# Patient Record
Sex: Male | Born: 1970 | Race: White | Hispanic: No | Marital: Married | Smoking: Never smoker
Health system: Southern US, Community
[De-identification: ages and names within clinical notes are randomized; demographics above are authoritative.]

## PROBLEM LIST (undated history)

## (undated) DIAGNOSIS — C3490 Malignant neoplasm of unspecified part of unspecified bronchus or lung: Secondary | ICD-10-CM

## (undated) DIAGNOSIS — R05 Cough: Secondary | ICD-10-CM

## (undated) DIAGNOSIS — Z9889 Other specified postprocedural states: Secondary | ICD-10-CM

## (undated) DIAGNOSIS — Z8739 Personal history of other diseases of the musculoskeletal system and connective tissue: Secondary | ICD-10-CM

## (undated) DIAGNOSIS — J45909 Unspecified asthma, uncomplicated: Secondary | ICD-10-CM

## (undated) DIAGNOSIS — M1990 Unspecified osteoarthritis, unspecified site: Secondary | ICD-10-CM

## (undated) DIAGNOSIS — K219 Gastro-esophageal reflux disease without esophagitis: Secondary | ICD-10-CM

## (undated) DIAGNOSIS — F32A Depression, unspecified: Secondary | ICD-10-CM

## (undated) DIAGNOSIS — F419 Anxiety disorder, unspecified: Secondary | ICD-10-CM

## (undated) DIAGNOSIS — F329 Major depressive disorder, single episode, unspecified: Secondary | ICD-10-CM

## (undated) DIAGNOSIS — E119 Type 2 diabetes mellitus without complications: Secondary | ICD-10-CM

## (undated) DIAGNOSIS — Z8709 Personal history of other diseases of the respiratory system: Secondary | ICD-10-CM

## (undated) DIAGNOSIS — R112 Nausea with vomiting, unspecified: Secondary | ICD-10-CM

## (undated) DIAGNOSIS — J189 Pneumonia, unspecified organism: Secondary | ICD-10-CM

## (undated) DIAGNOSIS — M2550 Pain in unspecified joint: Secondary | ICD-10-CM

## (undated) DIAGNOSIS — IMO0001 Reserved for inherently not codable concepts without codable children: Secondary | ICD-10-CM

## (undated) DIAGNOSIS — R059 Cough, unspecified: Secondary | ICD-10-CM

## (undated) HISTORY — PX: BACK SURGERY: SHX140

## (undated) HISTORY — PX: HERNIA REPAIR: SHX51

## (undated) HISTORY — DX: Unspecified asthma, uncomplicated: J45.909

## (undated) HISTORY — PX: OTHER SURGICAL HISTORY: SHX169

## (undated) HISTORY — DX: Malignant neoplasm of unspecified part of unspecified bronchus or lung: C34.90

## (undated) HISTORY — DX: Unspecified osteoarthritis, unspecified site: M19.90

## (undated) HISTORY — PX: KNEE ARTHROSCOPY: SUR90

## (undated) HISTORY — PX: JOINT REPLACEMENT: SHX530

## (undated) HISTORY — PX: APPENDECTOMY: SHX54

---

## 1998-07-18 ENCOUNTER — Inpatient Hospital Stay (HOSPITAL_COMMUNITY): Admission: RE | Admit: 1998-07-18 | Discharge: 1998-07-19 | Payer: Self-pay | Admitting: Neurosurgery

## 1998-08-08 ENCOUNTER — Inpatient Hospital Stay (HOSPITAL_COMMUNITY): Admission: RE | Admit: 1998-08-08 | Discharge: 1998-08-10 | Payer: Self-pay | Admitting: Neurosurgery

## 1998-12-31 ENCOUNTER — Ambulatory Visit (HOSPITAL_COMMUNITY): Admission: RE | Admit: 1998-12-31 | Discharge: 1998-12-31 | Payer: Self-pay | Admitting: Neurosurgery

## 1999-05-29 HISTORY — PX: BACK SURGERY: SHX140

## 2001-04-02 ENCOUNTER — Emergency Department (HOSPITAL_COMMUNITY): Admission: EM | Admit: 2001-04-02 | Discharge: 2001-04-02 | Payer: Self-pay | Admitting: Emergency Medicine

## 2011-04-29 ENCOUNTER — Emergency Department (HOSPITAL_COMMUNITY): Payer: BC Managed Care – PPO

## 2011-04-29 ENCOUNTER — Encounter: Payer: Self-pay | Admitting: Emergency Medicine

## 2011-04-29 ENCOUNTER — Emergency Department (HOSPITAL_COMMUNITY)
Admission: EM | Admit: 2011-04-29 | Discharge: 2011-04-29 | Disposition: A | Discharge status: Home / Self Care | Payer: BC Managed Care – PPO | Attending: Emergency Medicine | Admitting: Emergency Medicine

## 2011-04-29 DIAGNOSIS — S060X9A Concussion with loss of consciousness of unspecified duration, initial encounter: Secondary | ICD-10-CM

## 2011-04-29 DIAGNOSIS — S060X1A Concussion with loss of consciousness of 30 minutes or less, initial encounter: Secondary | ICD-10-CM | POA: Insufficient documentation

## 2011-04-29 DIAGNOSIS — R110 Nausea: Secondary | ICD-10-CM | POA: Insufficient documentation

## 2011-04-29 DIAGNOSIS — F3289 Other specified depressive episodes: Secondary | ICD-10-CM | POA: Insufficient documentation

## 2011-04-29 DIAGNOSIS — Z79899 Other long term (current) drug therapy: Secondary | ICD-10-CM | POA: Insufficient documentation

## 2011-04-29 DIAGNOSIS — F329 Major depressive disorder, single episode, unspecified: Secondary | ICD-10-CM | POA: Insufficient documentation

## 2011-04-29 DIAGNOSIS — W1809XA Striking against other object with subsequent fall, initial encounter: Secondary | ICD-10-CM | POA: Insufficient documentation

## 2011-04-29 DIAGNOSIS — R413 Other amnesia: Secondary | ICD-10-CM | POA: Insufficient documentation

## 2011-04-29 DIAGNOSIS — R51 Headache: Secondary | ICD-10-CM | POA: Insufficient documentation

## 2011-04-29 DIAGNOSIS — F29 Unspecified psychosis not due to a substance or known physiological condition: Secondary | ICD-10-CM | POA: Insufficient documentation

## 2011-04-29 DIAGNOSIS — M542 Cervicalgia: Secondary | ICD-10-CM | POA: Insufficient documentation

## 2011-04-29 HISTORY — DX: Major depressive disorder, single episode, unspecified: F32.9

## 2011-04-29 HISTORY — DX: Depression, unspecified: F32.A

## 2011-04-29 LAB — COMPREHENSIVE METABOLIC PANEL
Albumin: 3.8 g/dL (ref 3.5–5.2)
Alkaline Phosphatase: 65 U/L (ref 39–117)
BUN: 19 mg/dL (ref 6–23)
Potassium: 4 mEq/L (ref 3.5–5.1)
Total Protein: 7 g/dL (ref 6.0–8.3)

## 2011-04-29 LAB — URINALYSIS, MICROSCOPIC ONLY
Bilirubin Urine: NEGATIVE
Hgb urine dipstick: NEGATIVE
Specific Gravity, Urine: 1.022 (ref 1.005–1.030)
pH: 7 (ref 5.0–8.0)

## 2011-04-29 LAB — CBC
HCT: 40.9 % (ref 39.0–52.0)
MCV: 87.8 fL (ref 78.0–100.0)
RBC: 4.66 MIL/uL (ref 4.22–5.81)
WBC: 6.5 10*3/uL (ref 4.0–10.5)

## 2011-04-29 LAB — LACTIC ACID, PLASMA: Lactic Acid, Venous: 1.6 mmol/L (ref 0.5–2.2)

## 2011-04-29 LAB — PROTIME-INR
INR: 1.01 (ref 0.00–1.49)
Prothrombin Time: 13.5 seconds (ref 11.6–15.2)

## 2011-04-29 LAB — POCT I-STAT, CHEM 8
Calcium, Ion: 1.12 mmol/L (ref 1.12–1.32)
Glucose, Bld: 85 mg/dL (ref 70–99)
HCT: 41 % (ref 39.0–52.0)
Hemoglobin: 13.9 g/dL (ref 13.0–17.0)

## 2011-04-29 LAB — SAMPLE TO BLOOD BANK

## 2011-04-29 MED ORDER — ONDANSETRON 8 MG PO TBDP: 8.0000 mg | ORAL_TABLET | Freq: Three times a day (TID) | ORAL | Status: AC | PRN

## 2011-04-29 MED ORDER — HYDROCODONE-ACETAMINOPHEN 5-325 MG PO TABS: 1.0000 | ORAL_TABLET | Freq: Four times a day (QID) | ORAL | Status: AC | PRN

## 2011-04-29 MED ORDER — ONDANSETRON HCL 4 MG/2ML IJ SOLN
INTRAMUSCULAR | Status: AC
  Administered 2011-04-29: 17:00:00
  Filled 2011-04-29: qty 4

## 2011-04-29 NOTE — ED Notes (Signed)
Paged Trauma to [phone] Dr Deretha Emory

## 2011-04-29 NOTE — ED Notes (Signed)
Pt reported to be swinging from vine when he fell and hit head on log.  Positive loss of consciousness.

## 2011-04-29 NOTE — ED Notes (Signed)
Pt returned from CT at this time.  

## 2011-04-29 NOTE — ED Provider Notes (Signed)
History     CSN: [id number] Arrival date & time: 04/29/2011  4:52 PM   First MD Initiated Contact with Patient 04/29/11 1705      Chief Complaint  Patient presents with  . Fall  . Head Injury  . Trauma    (Consider location/radiation/quality/duration/timing/severity/associated sxs/prior treatment) Patient is a 40 y.o. male presenting with head injury. The history is provided by the patient and the EMS personnel.  Head Injury  The incident occurred 1 to 2 hours ago. He came to the ER via EMS. The injury mechanism was a direct blow and a fall. He lost consciousness for a period of 1 to 5 minutes. There was no blood loss. The quality of the pain is described as dull. The pain is at a severity of 6/10. The pain is moderate. The pain has been constant since the injury. Associated symptoms include disorientation and memory loss. Pertinent negatives include no numbness, no blurred vision, no vomiting, no tinnitus and no weakness. Associated symptoms comments: Nausea. He was found conscious, confused and responsive to voice by EMS personnel. Treatment on the scene included a backboard, a c-collar and IV fluid. Treatments tried: Patient given Zofran and route by EMS.    Patient was swinging from a fine with his children when he fell off a line and struck the back of his head on a fallen tree. Patient had definite loss of consciousness most likely less than 5 minutes. Awoke confused could not remember events [place] recognizing family. EMS when they got to the scene the patient was up and ambulating but had memory loss, was able to follow commands  Past Medical History  Diagnosis Date  . Depression     Past Surgical History  Procedure Date  . Back surgery     History reviewed. No pertinent family history.  History  Substance Use Topics  . Smoking status: Not on file  . Smokeless tobacco: Not on file  . Alcohol Use: No      Review of Systems  Constitutional: Negative for fever and  chills.  HENT: Positive for neck pain and neck stiffness. Negative for nosebleeds, congestion, drooling, trouble swallowing, voice change and tinnitus.   Eyes: Negative for blurred vision, photophobia and visual disturbance.  Respiratory: Negative for cough, chest tightness and shortness of breath.   Cardiovascular: Negative for chest pain, palpitations and leg swelling.  Gastrointestinal: Positive for nausea. Negative for vomiting, abdominal pain and diarrhea.  Genitourinary: Negative for dysuria and hematuria.  Musculoskeletal: Negative for back pain and joint swelling.  Skin: Negative for rash and wound.  Neurological: Positive for headaches. Negative for facial asymmetry, speech difficulty, weakness and numbness.  Psychiatric/Behavioral: Positive for memory loss and confusion.    Allergies  Ciprofloxacin and Naproxen  Home Medications   Current Outpatient Rx  Name Route Sig Dispense Refill  . DULOXETINE HCL 60 MG PO CPEP Oral Take 60 mg by mouth daily.      . OMEGA-3 FATTY ACIDS 1000 MG PO CAPS Oral Take 2 g by mouth daily.      Marland Kitchen HYDROCODONE-ACETAMINOPHEN 5-325 MG PO TABS Oral Take 1-2 tablets by mouth every 6 (six) hours as needed for pain. 20 tablet 0  . ONDANSETRON 8 MG PO TBDP Oral Take 1 tablet (8 mg total) by mouth every 8 (eight) hours as needed for nausea. 12 tablet 0    BP 136/77  Pulse 94  Temp(Src) 98.1 F (36.7 C) (Oral)  Resp 26  Ht 5\' 10"  (1.778 m)  Wt 208 lb (94.348 kg)  BMI 29.84 kg/m2  SpO2 98%  Physical Exam  Nursing note and vitals reviewed. Constitutional: He appears well-developed and well-nourished.  HENT:  Right Ear: External ear normal.  Left Ear: External ear normal.  Mouth/Throat: Oropharynx is clear and moist.       Tender posterior scalp no sniffing and hematoma abrasion or step off.  Eyes: Conjunctivae and EOM are normal. Pupils are equal, round, and reactive to light.  Neck: No JVD present. No tracheal deviation present.       No  midline tenderness but mild paraspinous tenderness.  Cardiovascular: Normal rate, regular rhythm, normal heart sounds and intact distal pulses.   No murmur heard. Pulmonary/Chest: Effort normal and breath sounds normal. No respiratory distress. He exhibits no tenderness.  Abdominal: Soft. Bowel sounds are normal. There is no tenderness.  Genitourinary:       No pelvic instability or tenderness  Musculoskeletal: Normal range of motion. He exhibits no edema and no tenderness.  Lymphadenopathy:    He has no cervical adenopathy.  Neurological: He is alert. No cranial nerve deficit. He exhibits normal muscle tone. Coordination normal.       Positive memory deficit for immediate events prior to the injury during the injury and shortly after the injury. Back nontender no neurological deficits other than the memory deficit.  Skin: Skin is warm and dry. No rash noted. He is not diaphoretic. No erythema.    ED Course  Procedures (including critical care time)  Labs Reviewed  COMPREHENSIVE METABOLIC PANEL - Abnormal; Notable for the following:    GFR calc non Af Amer 79 (*)    All other components within normal limits  CBC  URINALYSIS, MICROSCOPIC ONLY  LACTIC ACID, PLASMA  PROTIME-INR  SAMPLE TO BLOOD BANK  POCT I-STAT, CHEM 8  I-STAT, CHEM 8    Date: 04/29/2011  Rate: 101  Rhythm: sinus tachycardia  QRS Axis: normal  Intervals: normal  ST/T Wave abnormalities: normal  Conduction Disutrbances:none  Narrative Interpretation:   Old EKG Reviewed: none available   Results for orders placed during the hospital encounter of 04/29/11  COMPREHENSIVE METABOLIC PANEL      Component Value Range   Sodium 142  135 - 145 (mEq/L)   Potassium 4.0  3.5 - 5.1 (mEq/L)   Chloride 105  96 - 112 (mEq/L)   CO2 27  19 - 32 (mEq/L)   Glucose, Bld 83  70 - 99 (mg/dL)   BUN 19  6 - 23 (mg/dL)   Creatinine, Ser 1.61  0.50 - 1.35 (mg/dL)   Calcium 8.8  8.4 - 09.6 (mg/dL)   Total Protein 7.0  6.0 - 8.3  (g/dL)   Albumin 3.8  3.5 - 5.2 (g/dL)   AST 26  0 - 37 (U/L)   ALT 48  0 - 53 (U/L)   Alkaline Phosphatase 65  39 - 117 (U/L)   Total Bilirubin 0.4  0.3 - 1.2 (mg/dL)   GFR calc non Af Amer 79 (*) >90 (mL/min)   GFR calc Af Amer >90  >90 (mL/min)  CBC      Component Value Range   WBC 6.5  4.0 - 10.5 (K/uL)   RBC 4.66  4.22 - 5.81 (MIL/uL)   Hemoglobin 14.4  13.0 - 17.0 (g/dL)   HCT 04.5  40.9 - 81.1 (%)   MCV 87.8  78.0 - 100.0 (fL)   MCH 30.9  26.0 - 34.0 (pg)   MCHC 35.2  30.0 - 36.0 (g/dL)   RDW 16.1  09.6 - 04.5 (%)   Platelets 223  150 - 400 (K/uL)  URINALYSIS, MICROSCOPIC ONLY      Component Value Range   Color, Urine YELLOW  YELLOW    APPearance CLEAR  CLEAR    Specific Gravity, Urine 1.022  1.005 - 1.030    pH 7.0  5.0 - 8.0    Glucose, UA NEGATIVE  NEGATIVE (mg/dL)   Hgb urine dipstick NEGATIVE  NEGATIVE    Bilirubin Urine NEGATIVE  NEGATIVE    Ketones, ur NEGATIVE  NEGATIVE (mg/dL)   Protein, ur NEGATIVE  NEGATIVE (mg/dL)   Urobilinogen, UA 1.0  0.0 - 1.0 (mg/dL)   Nitrite NEGATIVE  NEGATIVE    Leukocytes, UA NEGATIVE  NEGATIVE    WBC, UA 0-2  <3 (WBC/hpf)   RBC / HPF 0-2  <3 (RBC/hpf)  LACTIC ACID, PLASMA      Component Value Range   Lactic Acid, Venous 1.6  0.5 - 2.2 (mmol/L)  PROTIME-INR      Component Value Range   Prothrombin Time 13.5  11.6 - 15.2 (seconds)   INR 1.01  0.00 - 1.49   SAMPLE TO BLOOD BANK      Component Value Range   Blood Bank Specimen SAMPLE AVAILABLE FOR TESTING     Sample Expiration 04/30/2011    POCT I-STAT, CHEM 8      Component Value Range   Sodium 144  135 - 145 (mEq/L)   Potassium 4.1  3.5 - 5.1 (mEq/L)   Chloride 106  96 - 112 (mEq/L)   BUN 21  6 - 23 (mg/dL)   Creatinine, Ser 4.09  0.50 - 1.35 (mg/dL)   Glucose, Bld 85  70 - 99 (mg/dL)   Calcium, Ion 8.11  9.14 - 1.32 (mmol/L)   TCO2 27  0 - 100 (mmol/L)   Hemoglobin 13.9  13.0 - 17.0 (g/dL)   HCT 78.2  95.6 - 21.3 (%)   Results for orders placed during the hospital  encounter of 04/29/11  COMPREHENSIVE METABOLIC PANEL      Component Value Range   Sodium 142  135 - 145 (mEq/L)   Potassium 4.0  3.5 - 5.1 (mEq/L)   Chloride 105  96 - 112 (mEq/L)   CO2 27  19 - 32 (mEq/L)   Glucose, Bld 83  70 - 99 (mg/dL)   BUN 19  6 - 23 (mg/dL)   Creatinine, Ser 0.86  0.50 - 1.35 (mg/dL)   Calcium 8.8  8.4 - 57.8 (mg/dL)   Total Protein 7.0  6.0 - 8.3 (g/dL)   Albumin 3.8  3.5 - 5.2 (g/dL)   AST 26  0 - 37 (U/L)   ALT 48  0 - 53 (U/L)   Alkaline Phosphatase 65  39 - 117 (U/L)   Total Bilirubin 0.4  0.3 - 1.2 (mg/dL)   GFR calc non Af Amer 79 (*) >90 (mL/min)   GFR calc Af Amer >90  >90 (mL/min)  CBC      Component Value Range   WBC 6.5  4.0 - 10.5 (K/uL)   RBC 4.66  4.22 - 5.81 (MIL/uL)   Hemoglobin 14.4  13.0 - 17.0 (g/dL)   HCT 46.9  62.9 - 52.8 (%)   MCV 87.8  78.0 - 100.0 (fL)   MCH 30.9  26.0 - 34.0 (pg)   MCHC 35.2  30.0 - 36.0 (g/dL)   RDW 41.3  24.4 - 01.0 (%)  Platelets 223  150 - 400 (K/uL)  URINALYSIS, MICROSCOPIC ONLY      Component Value Range   Color, Urine YELLOW  YELLOW    APPearance CLEAR  CLEAR    Specific Gravity, Urine 1.022  1.005 - 1.030    pH 7.0  5.0 - 8.0    Glucose, UA NEGATIVE  NEGATIVE (mg/dL)   Hgb urine dipstick NEGATIVE  NEGATIVE    Bilirubin Urine NEGATIVE  NEGATIVE    Ketones, ur NEGATIVE  NEGATIVE (mg/dL)   Protein, ur NEGATIVE  NEGATIVE (mg/dL)   Urobilinogen, UA 1.0  0.0 - 1.0 (mg/dL)   Nitrite NEGATIVE  NEGATIVE    Leukocytes, UA NEGATIVE  NEGATIVE    WBC, UA 0-2  <3 (WBC/hpf)   RBC / HPF 0-2  <3 (RBC/hpf)  LACTIC ACID, PLASMA      Component Value Range   Lactic Acid, Venous 1.6  0.5 - 2.2 (mmol/L)  PROTIME-INR      Component Value Range   Prothrombin Time 13.5  11.6 - 15.2 (seconds)   INR 1.01  0.00 - 1.49   SAMPLE TO BLOOD BANK      Component Value Range   Blood Bank Specimen SAMPLE AVAILABLE FOR TESTING     Sample Expiration 04/30/2011    POCT I-STAT, CHEM 8      Component Value Range   Sodium 144   135 - 145 (mEq/L)   Potassium 4.1  3.5 - 5.1 (mEq/L)   Chloride 106  96 - 112 (mEq/L)   BUN 21  6 - 23 (mg/dL)   Creatinine, Ser 2.95  0.50 - 1.35 (mg/dL)   Glucose, Bld 85  70 - 99 (mg/dL)   Calcium, Ion 6.21  3.08 - 1.32 (mmol/L)   TCO2 27  0 - 100 (mmol/L)   Hemoglobin 13.9  13.0 - 17.0 (g/dL)   HCT 65.7  84.6 - 96.2 (%)   Ct Head Wo Contrast  04/29/2011  *RADIOLOGY REPORT*  Clinical Data:  Status post fall.  Headache and neck pain.  CT HEAD WITHOUT CONTRAST CT CERVICAL SPINE WITHOUT CONTRAST  Technique:  Multidetector CT imaging of the head and cervical spine was performed following the standard protocol without intravenous contrast.  Multiplanar CT image reconstructions of the cervical spine were also generated.  Comparison:  None.  CT HEAD  Findings: There is no evidence of acute intracranial hemorrhage, mass lesion, brain edema or extra-axial fluid collection.  The ventricles and subarachnoid spaces are appropriately sized for age. There is no CT evidence of acute cortical infarction.  There is maxillary sinus mucosal thickening with possible air fluid levels bilaterally.  The additional paranasal sinuses appear clear. Mastoids and middle ears are clear. The calvarium is intact.  IMPRESSION:  1.  No acute intracranial or calvarial findings. 2.  Bilateral maxillary sinus disease with possible air fluid levels.  CT CERVICAL SPINE  Findings: The cervical alignment is normal.  There is no evidence of acute fracture or traumatic subluxation.  No acute soft tissue findings are identified.  There is minimal spondylosis and no osseous neural foraminal stenosis.  IMPRESSION: Negative for acute cervical spine fracture, subluxation or static signs of instability.  Original Report Authenticated By: Gerrianne Scale, M.D.   Ct Cervical Spine Wo Contrast  04/29/2011  *RADIOLOGY REPORT*  Clinical Data:  Status post fall.  Headache and neck pain.  CT HEAD WITHOUT CONTRAST CT CERVICAL SPINE WITHOUT CONTRAST   Technique:  Multidetector CT imaging of the head and cervical  spine was performed following the standard protocol without intravenous contrast.  Multiplanar CT image reconstructions of the cervical spine were also generated.  Comparison:  None.  CT HEAD  Findings: There is no evidence of acute intracranial hemorrhage, mass lesion, brain edema or extra-axial fluid collection.  The ventricles and subarachnoid spaces are appropriately sized for age. There is no CT evidence of acute cortical infarction.  There is maxillary sinus mucosal thickening with possible air fluid levels bilaterally.  The additional paranasal sinuses appear clear. Mastoids and middle ears are clear. The calvarium is intact.  IMPRESSION:  1.  No acute intracranial or calvarial findings. 2.  Bilateral maxillary sinus disease with possible air fluid levels.  CT CERVICAL SPINE  Findings: The cervical alignment is normal.  There is no evidence of acute fracture or traumatic subluxation.  No acute soft tissue findings are identified.  There is minimal spondylosis and no osseous neural foraminal stenosis.  IMPRESSION: Negative for acute cervical spine fracture, subluxation or static signs of instability.  Original Report Authenticated By: Gerrianne Scale, M.D.   Dg Chest Portable 1 View  04/29/2011  *RADIOLOGY REPORT*  Clinical Data: Head trauma.  No chest complaints.  PORTABLE CHEST - 1 VIEW  Comparison: None.  Findings: There are low lung volumes with mild resulting perihilar atelectasis bilaterally.  No airspace opacity, edema or pleural effusion is present.  There is no pneumothorax.  The heart size and mediastinal contours are normal.  Telemetry leads overlie the chest.  IMPRESSION: Low lung volumes with resulting perihilar atelectasis.  Original Report Authenticated By: Gerrianne Scale, M.D.       1. Closed head injury with concussion       MDM  The patient arrived to the emergency department from [facility] as a level  II trauma. TRAUMA ALERT was made. He should upon arrival to the emergency department was awake but was amnestic to the events but was starting to recall events prior to the injury and some events after the injury. No evidence of any other injury other than a posterior head injury. CT scan of the head and neck were negative. Patient's presentation and history consistent with closed head injury with loss of consciousness and concussion. There was no evidence of any neuro deficits other than the memory loss.   During his stay in the emergency department he started to recall his family members started to recall more events of the past still has no definite recollection of the injury. Discussed with on call trauma surgeon since it was a level II trauma patient does have a primary care provider in the [place] area, okay to be discharged home and followup with his primary care provider in the next few days to determine when would be safe to return to work.   At time of discharge patient only had complained of him mild posterior headache, slight paraspinous neck stiffness but had good range of motion no spinal tenderness, denied chest or abdominal pain neuro exam showed no focal deficits.  CRITICAL CARE Performed by: Shelda Jakes.   Total critical care time: 30  Critical care time was exclusive of separately billable procedures and treating other patients.  Critical care was necessary to treat or prevent imminent or life-threatening deterioration.  Critical care was time spent personally by me on the following activities: development of treatment plan with patient and/or surrogate as well as nursing, discussions with consultants, evaluation of patient's response to treatment, examination of patient, obtaining history from patient  or surrogate, ordering and performing treatments and interventions, ordering and review of laboratory studies, ordering and review of radiographic studies, pulse oximetry and  re-evaluation of patient's condition.        Shelda Jakes, MD 04/29/11 2111

## 2014-06-23 ENCOUNTER — Ambulatory Visit (INDEPENDENT_AMBULATORY_CARE_PROVIDER_SITE_OTHER): Payer: Commercial Managed Care - PPO | Admitting: Internal Medicine

## 2014-06-23 ENCOUNTER — Encounter (INDEPENDENT_AMBULATORY_CARE_PROVIDER_SITE_OTHER): Payer: Self-pay

## 2014-06-23 ENCOUNTER — Encounter: Payer: Self-pay | Admitting: Internal Medicine

## 2014-06-23 ENCOUNTER — Ambulatory Visit (INDEPENDENT_AMBULATORY_CARE_PROVIDER_SITE_OTHER)
Admission: RE | Admit: 2014-06-23 | Discharge: 2014-06-23 | Disposition: A | Discharge status: Home / Self Care | Payer: Commercial Managed Care - PPO | Source: Ambulatory Visit | Attending: Internal Medicine | Admitting: Internal Medicine

## 2014-06-23 VITALS — BP 128/80 | HR 107 | Temp 98.1°F | Ht 70.0 in | Wt 212.0 lb

## 2014-06-23 DIAGNOSIS — R05 Cough: Secondary | ICD-10-CM | POA: Insufficient documentation

## 2014-06-23 DIAGNOSIS — R059 Cough, unspecified: Secondary | ICD-10-CM

## 2014-06-23 DIAGNOSIS — J852 Abscess of lung without pneumonia: Secondary | ICD-10-CM

## 2014-06-23 DIAGNOSIS — J4530 Mild persistent asthma, uncomplicated: Secondary | ICD-10-CM

## 2014-06-23 MED ORDER — BUDESONIDE-FORMOTEROL FUMARATE 80-4.5 MCG/ACT IN AERO: INHALATION_SPRAY | RESPIRATORY_TRACT | Status: DC

## 2014-06-23 NOTE — Progress Notes (Signed)
Quick Note:  LMTCB ______ 

## 2014-06-23 NOTE — Patient Instructions (Addendum)
pepcid 20 mg and chlortrimeton 4 mg one at bedtime  GERD (REFLUX)  is an extremely common cause of respiratory symptoms just like yours , many times with no obvious heartburn at all.    It can be treated with medication, but also with lifestyle changes including avoidance of late meals, excessive alcohol, smoking cessation, and avoid fatty foods, chocolate, peppermint, colas, red wine, and acidic juices such as orange juice.  NO MINT OR MENTHOL PRODUCTS SO NO COUGH DROPS  USE SUGARLESS CANDY INSTEAD (Jolley ranchers or Stover's or Life Savers) or even ice chips will also do - the key is to swallow to prevent all throat clearing. NO OIL BASED VITAMINS>> so stop fish oil - use powdered substitutes where available  Cough > mucinex dm 1200 mg every 12 hours  Please see patient coordinator before you leave today  to schedule sinus CT   Please remember to go to the  x-ray department downstairs for your tests - we will call you with the results when they are available.  Please schedule a follow up office visit in 2 weeks, sooner if needed

## 2014-06-23 NOTE — Progress Notes (Signed)
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 Advice Only    Referred by Dr. Owens Shark lung abscess:  pt has cough with clear to foamy sl. green at times. Pt has wheezing and sob also chest tightenss/congestion.  baseline no inhalers but sues  symbicort and both neb and alb hfa daily but then 3 weeks prior to OV   acutely ill and using symbicort ? Strength 2 every 12 h and needing rescue less and neb once  A day   Acutely ill fever, chills, lower back pain L, increased need for inhalers preceded x 2-3 weeks by sinus infection a week off omnicef before fever developed, rx levaquin and 12 days of zpak and pred rex. Now feels  75% better with cough / congestion as main symptoms. Denies excess etoh  No obvious day to day or daytime variabilty or assoc   cp or hb symptoms. No unusual exp hx or h/o childhood pna/ asthma or knowledge of premature birth.  Sleeping ok without nocturnal  or early am exacerbation  of respiratory  c/o's or need for noct saba. Also denies any obvious fluctuation of symptoms with weather or environmental changes or other aggravating or alleviating factors except as outlined above   Current Medications, Allergies, Complete Past Medical History, Past Surgical History, Family History, and Social History were reviewed in [employer] record.  ROS   The following are not active complaints unless bolded sore throat, dysphagia, dental problems, itching, sneezing,  nasal congestion or excess/ purulent secretions, ear ache,   fever, chills, sweats, unintended wt loss, pleuritic or exertional cp, hemoptysis,  orthopnea pnd or leg swelling, presyncope, palpitations, heartburn, abdominal pain, anorexia, nausea, vomiting, diarrhea  or change in bowel or urinary habits, change in stools or urine, dysuria,hematuria,  rash, arthralgias, visual complaints, headache, numbness weakness or ataxia or problems with walking or coordination,  change in mood/affect or memory.         Review of Systems     Objective:   Physical Exam     Wt Readings from Last 3 Encounters:  06/23/14 212 lb (96.163 kg)  04/29/11 208 lb (94.348 kg)    Vital signs reviewed   HEENT: nl dentition, turbinates, and orophanx. Nl external ear canals without cough reflex   NECK :  without JVD/Nodes/TM/ nl carotid upstrokes bilaterally   LUNGS: no acc muscle use, clear to A and P bilaterally without cough on insp or exp maneuvers   CV:  RRR  no s3 or murmur or increase in P2, no edema   ABD:  soft and nontender with nl excursion in the supine position. No bruits or organomegaly, bowel sounds nl  MS:  warm without deformities, calf tenderness, cyanosis or clubbing  SKIN: warm and dry without lesions  NEURO:  alert, approp, no deficits     CXR PA and Lateral:   06/23/2014 :     I personally reviewed images and agree with radiology impression as follows:    Unchanged consolidation and cavitation in the superior segment left lower lobe. This could be infectious, inflammatory or neoplastic. If the patient clinically has pneumonia, continued follow-up radiography is recommended to confirm complete clearance.  Assessment:

## 2014-06-27 ENCOUNTER — Encounter: Payer: Self-pay | Admitting: Internal Medicine

## 2014-06-27 DIAGNOSIS — J45909 Unspecified asthma, uncomplicated: Secondary | ICD-10-CM | POA: Insufficient documentation

## 2014-06-27 MED ORDER — AMOXICILLIN-POT CLAVULANATE 875-125 MG PO TABS: 1.0000 | ORAL_TABLET | Freq: Two times a day (BID) | ORAL | Status: DC

## 2014-06-27 NOTE — Assessment & Plan Note (Signed)
DDX of  difficult airways management all start with A and  include Adherence, Ace Inhibitors, Acid Reflux, Active Sinus Disease, Alpha 1 Antitripsin deficiency, Anxiety masquerading as Airways dz,  ABPA,  allergy(esp in young), Aspiration (esp in elderly), Adverse effects of DPI,  Active smokers, plus two Bs  = Bronchiectasis and Beta blocker use..and one C= CHF  Adherence is always the initial "prime suspect" and is a multilayered concern that requires a "trust but verify" approach in every patient - starting with knowing how to use medications, especially inhalers, correctly, keeping up with refills and understanding the fundamental difference between maintenance and prns vs those medications only taken for a very short course and then stopped and not refilled.   In this case Adherence is the biggest issue and starts with  inability to use HFA effectively and also  understand that SABA treats the symptoms but doesn't get to the underlying problem (inflammation).  I used  the analogy of putting steroid cream on a rash to help explain the meaning of topical therapy and the need to get the drug to the target tissue.   - The proper method of use, as well as anticipated side effects, of a metered-dose inhaler are discussed and demonstrated to the patient. Improved effectiveness after extensive coaching during this visit to a level of approximately  75% so continue symbicort 2 bid and use saba hfa >> neb  ? Active sinus dz > neg sinus ct noted  ? Allergies > eval later    Each maintenance medication was reviewed in detail including most importantly the difference between maintenance and as needed and under what circumstances the prns are to be used.  Please see instructions for details which were reviewed in writing and the patient given a copy.

## 2014-06-27 NOTE — Assessment & Plan Note (Signed)
No risk factors identified for lung abscess but this is in the typical distribution of an aspiration injury leading to a chronic pyogenic abscess so rec augmentin bid x 20 days then return for f/u cxr when complete

## 2014-06-27 NOTE — Assessment & Plan Note (Signed)
-   sinus ct 06/23/2014 > neg    Most likely cough on basis of lung abscess, advised to sleep on abd if possible to help clear it.

## 2014-06-29 ENCOUNTER — Telehealth: Payer: Self-pay | Admitting: *Deleted

## 2014-06-29 MED ORDER — AMOXICILLIN-POT CLAVULANATE 875-125 MG PO TABS: 1.0000 | ORAL_TABLET | Freq: Two times a day (BID) | ORAL | Status: DC

## 2014-06-29 NOTE — Telephone Encounter (Signed)
Spoke with the pt and notified of recs per MW  He verbalized understanding  Rx was sent to pharm and appt scheduled

## 2014-06-29 NOTE — Progress Notes (Signed)
Quick Note:  Pt aware ______ 

## 2014-06-29 NOTE — Telephone Encounter (Signed)
-----   Message from [name], MD sent at 06/27/2014 12:23 PM EST ----- Called in augmentin x 20 days on basis of xray/ct review, needs to return in 21 days for cxr - call sooner if losing ground

## 2014-07-07 ENCOUNTER — Ambulatory Visit: Payer: Commercial Managed Care - PPO | Admitting: Internal Medicine

## 2014-07-14 ENCOUNTER — Telehealth: Payer: Self-pay | Admitting: Internal Medicine

## 2014-07-14 NOTE — Telephone Encounter (Signed)
Spoke with pt, c/o increased prod cough with some green mucus, low grade fever of 99. No sob.  Is requesting recs.  Uses [facility].    MW please advise on recs.  Thanks!!

## 2014-07-14 NOTE — Telephone Encounter (Signed)
lmtcb X1 for pt  

## 2014-07-14 NOTE — Telephone Encounter (Signed)
Due to f/u cxr  after 20 days of augmentin so must have just finished it so ov this week, no rx in meantime

## 2014-07-14 NOTE — Telephone Encounter (Signed)
[phone], pt cb

## 2014-07-14 NOTE — Telephone Encounter (Signed)
Called and spoke to pt. Informed pt of the recs per MW. Appt made with MW on 2/18/216. Pt verbalized understanding and denied any further questions or concerns at this time.

## 2014-07-15 ENCOUNTER — Ambulatory Visit (INDEPENDENT_AMBULATORY_CARE_PROVIDER_SITE_OTHER)
Admission: RE | Admit: 2014-07-15 | Discharge: 2014-07-15 | Disposition: A | Discharge status: Home / Self Care | Payer: Commercial Managed Care - PPO | Source: Ambulatory Visit | Attending: Internal Medicine | Admitting: Internal Medicine

## 2014-07-15 ENCOUNTER — Ambulatory Visit (INDEPENDENT_AMBULATORY_CARE_PROVIDER_SITE_OTHER): Payer: Commercial Managed Care - PPO | Admitting: Internal Medicine

## 2014-07-15 ENCOUNTER — Encounter: Payer: Self-pay | Admitting: Internal Medicine

## 2014-07-15 DIAGNOSIS — J852 Abscess of lung without pneumonia: Secondary | ICD-10-CM

## 2014-07-15 MED ORDER — CLINDAMYCIN HCL 300 MG PO CAPS: ORAL_CAPSULE | ORAL | Status: DC

## 2014-07-15 NOTE — Progress Notes (Signed)
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 Advice Only    Referred by Dr. Owens Shark lung abscess:  pt has cough with clear to foamy sl. green at times. Pt has wheezing and sob also chest tightenss/congestion.  Chronically ill x sev years worse in November 2015 baseline no inhalers but uses   symbicort and both neb and alb hfa daily but then 3 weeks prior to OV   acutely ill and using symbicort ? Strength 2 every 12 h and needing rescue less and neb once  A day   Acutely ill fever, chills, lower back pain L, increased need for inhalers preceded x 2-3 weeks by sinus infection a week off omnicef before fever developed, rx levaquin and 12 days of zpak and pred rex. Now feels  75% better with cough / congestion as main symptoms. Denies excess etoh rec pepcid 20 mg and chlortrimeton 4 mg one at bedtime GERD  Cough > mucinex dm 1200 mg every 12 hours schedule sinus CT > neg      07/15/2014 f/u ov/Wert re:  LLL Lung abscess  Chief Complaint  Patient presents with  . Acute Visit    Pt c/o increased cough for the past 3 days- prod with large amounts of green sputum. He also c/o pressure in his chest and has had low grade fever for the past wk.   while still on augmentin for the last week or so has low grade fever 99.2  s chills/sweats had green sputum with some blood/ also assoc with sinus drainage  L back pain is some better   No obvious day to day or daytime variabilty or assoc   cp or hb symptoms. No unusual exp hx or h/o childhood pna/ asthma or knowledge of premature birth.  Sleeping ok without nocturnal  or early am exacerbation  of respiratory  c/o's or need for noct saba. Also denies any obvious fluctuation of symptoms with weather or environmental changes or other aggravating or alleviating factors except as outlined above   Current Medications, Allergies, Complete Past Medical History, Past Surgical History, Family History, and Social History were reviewed in [employer] record.  ROS  The following are not active complaints unless bolded sore throat, dysphagia, dental problems, itching, sneezing,  nasal congestion or excess/ purulent secretions, ear ache,   fever, chills, sweats, unintended wt loss, pleuritic or exertional cp, hemoptysis,  orthopnea pnd or leg swelling, presyncope, palpitations, heartburn, abdominal pain, anorexia, nausea, vomiting, diarrhea  or change in bowel or urinary habits, change in stools or urine, dysuria,hematuria,  rash, arthralgias, visual complaints, headache, numbness weakness or ataxia or problems with walking or coordination,  change in mood/affect or memory.  Objective:   Physical Exam  07/15/2014         214  Wt Readings from Last 3 Encounters:  06/23/14 212 lb (96.163 kg)  04/29/11 208 lb (94.348 kg)    Vital signs reviewed   HEENT: nl dentition, turbinates, and orophanx. Nl external ear canals without cough reflex   NECK :  without JVD/Nodes/TM/ nl carotid upstrokes bilaterally   LUNGS: no acc muscle use, clear to A and P bilaterally without cough on insp or exp maneuvers   CV:  RRR  no s3 or murmur or increase in P2, no edema   ABD:  soft and nontender with nl excursion in the supine position. No bruits or  organomegaly, bowel sounds nl  MS:  warm without deformities, calf tenderness, cyanosis or clubbing  SKIN: warm and dry without lesions    NEURO:  alert, approp, no deficits     CXR PA and Lateral:   06/23/2014 :     I personally reviewed images and agree with radiology impression as follows:    Unchanged consolidation and cavitation in the superior segment left lower lobe. This could be infectious, inflammatory or neoplastic. If the patient clinically has pneumonia, continued follow-up radiography is recommended to confirm complete clearance.  Assessment:

## 2014-07-15 NOTE — Patient Instructions (Addendum)
Clindamycin 300 x 2 three times a day   Sleep on your stomach as much as you can   Please schedule a follow up office visit in 2 weeks, sooner if needed with all old cxr's since 2012  Add change rx to rx clindamycin 600 tid x 5 days then 300  qid

## 2014-07-16 ENCOUNTER — Telehealth: Payer: Self-pay | Admitting: *Deleted

## 2014-07-16 NOTE — Telephone Encounter (Signed)
Patient wants to know if it is okay for him to use a Jacuzzi.  He says he has been going to the gym and working out and has been using the [employer] but he is not sure if he should be using the jacuzzi with this abcess. Will the heat from the jacuzzi help with the lung abcess?  Please advise.

## 2014-07-16 NOTE — Telephone Encounter (Signed)
Won't matter now On the clindamycin dosing he should use the 300x 2 dose through the weekend then just the 300 thereafter

## 2014-07-16 NOTE — Telephone Encounter (Signed)
Called and spoke to pt. Informed pt of the recs per MW. Pt verbalized understanding and denied any further questions or concerns at this time.

## 2014-07-18 ENCOUNTER — Encounter: Payer: Self-pay | Admitting: Internal Medicine

## 2014-07-18 NOTE — Assessment & Plan Note (Addendum)
-   augmentin bid x 20 days 06/27/2014 > d/c 07/15/14 and rx clindamycin 600 tid x 5 days then 300  qid  I had an extended discussion with the patient reviewing all relevant studies completed to date and  lasting 15 to 20 minutes of a 25 minute visit on the following ongoing concerns:  1) he has failed augmentin  2) Clindamycin is an alternative but may also cause severe diarreat so important to take probiotic or yogurt  3) postural drainage also key  4) f/u in 4 weeks with old cxr's if available    Each maintenance medication was reviewed in detail including most importantly the difference between maintenance and as needed and under what circumstances the prns are to be used.  Please see instructions for details which were reviewed in writing and the patient given a copy.

## 2014-07-19 ENCOUNTER — Telehealth: Payer: Self-pay | Admitting: *Deleted

## 2014-07-19 NOTE — Telephone Encounter (Signed)
-----   Message from [name], MD sent at 07/18/2014  6:35 AM EST ----- Add change rx to rx clindamycin 600 tid x 5 days total  then 300  Qid thereafter until uses them up

## 2014-07-19 NOTE — Telephone Encounter (Signed)
Spoke with the pt and notified of recs per MW He verbalized understanding  He states that he has plenty of med and will call if needed for refill, but pretty sure he has enough

## 2014-07-20 ENCOUNTER — Ambulatory Visit: Payer: Commercial Managed Care - PPO | Admitting: Internal Medicine

## 2014-07-26 ENCOUNTER — Telehealth: Payer: Self-pay | Admitting: Internal Medicine

## 2014-07-26 MED ORDER — CLINDAMYCIN HCL 300 MG PO CAPS: ORAL_CAPSULE | ORAL | Status: DC

## 2014-07-26 NOTE — Telephone Encounter (Signed)
lmtcb for pt.  

## 2014-07-26 NOTE — Telephone Encounter (Signed)
Plan is to 300 mg 4 x daily x 2 more weeks - be sure he understands it's one x 300 mg x four times daily or 4 pills daily x 14 days so needs #56

## 2014-07-26 NOTE — Telephone Encounter (Signed)
Patient needs refill of Clindimyacin.  Patient says Dr. Melvyn Novas has increased prescription to 4 pills daily.  Patient says he was told to call us to let us know if he needed another refill. Patient would like refill called into urgent care pharmacy [phone]    Dr. Melvyn Novas, please advise if [facility] to refill and how much you would like patient to continue taking.  Thanks.

## 2014-07-26 NOTE — Telephone Encounter (Signed)
Rx called into pharmacy.  Patient notified instructions on how to take medication properly.  Nothing further needed.

## 2014-07-26 NOTE — Telephone Encounter (Signed)
Pt returning call.Andrew Roth ° °

## 2014-07-26 NOTE — Telephone Encounter (Signed)
Left message for patient to call back  

## 2014-07-26 NOTE — Telephone Encounter (Signed)
Pt returning call - [phone]

## 2014-07-29 ENCOUNTER — Ambulatory Visit (INDEPENDENT_AMBULATORY_CARE_PROVIDER_SITE_OTHER)
Admission: RE | Admit: 2014-07-29 | Discharge: 2014-07-29 | Disposition: A | Discharge status: Home / Self Care | Payer: Commercial Managed Care - PPO | Source: Ambulatory Visit | Attending: Adult Health | Admitting: Adult Health

## 2014-07-29 ENCOUNTER — Encounter: Payer: Self-pay | Admitting: Adult Health

## 2014-07-29 ENCOUNTER — Ambulatory Visit (INDEPENDENT_AMBULATORY_CARE_PROVIDER_SITE_OTHER): Payer: Commercial Managed Care - PPO | Admitting: Adult Health

## 2014-07-29 ENCOUNTER — Ambulatory Visit: Payer: Commercial Managed Care - PPO | Admitting: Internal Medicine

## 2014-07-29 VITALS — BP 122/84 | HR 84 | Temp 98.1°F | Ht 70.0 in | Wt 216.6 lb

## 2014-07-29 DIAGNOSIS — J852 Abscess of lung without pneumonia: Secondary | ICD-10-CM

## 2014-07-29 NOTE — Progress Notes (Signed)
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 Advice Only    Referred by Dr. Owens Shark lung abscess:  pt has cough with clear to foamy sl. green at times. Pt has wheezing and sob also chest tightenss/congestion.  Chronically ill x sev years worse in November 2015 baseline no inhalers but uses   symbicort and both neb and alb hfa daily but then 3 weeks prior to OV   acutely ill and using symbicort ? Strength 2 every 12 h and needing rescue less and neb once  A day   Acutely ill fever, chills, lower back pain L, increased need for inhalers preceded x 2-3 weeks by sinus infection a week off omnicef before fever developed, rx levaquin and 12 days of zpak and pred rex. Now feels  75% better with cough / congestion as main symptoms. Denies excess etoh rec pepcid 20 mg and chlortrimeton 4 mg one at bedtime GERD  Cough > mucinex dm 1200 mg every 12 hours schedule sinus CT > neg      07/15/2014 f/u ov/Wert re:  LLL Lung abscess  Chief Complaint  Patient presents with  . Acute Visit    Pt c/o increased cough for the past 3 days- prod with large amounts of green sputum. He also c/o pressure in his chest and has had low grade fever for the past wk.   while still on augmentin for the last week or so has low grade fever 99.2 s  chills/sweats had green sputum with some blood/ also assoc with sinus drainage  L back pain is some better  >>clindamycin rx   07/29/2014 Follow up : LLL lung Abscess   Patient returns for a two-week follow-up Patient has a left lower large  lung abscess. Patient had a non-resolving left-sided pneumonia after 2 antibiotics. CT chest was done showing a left lower lobe lung abscess CT sinus was negative Last visit. Patient had persistent symptoms and no change on chest x-ray. Patient was changed over to clindamycin. Patient says since last visit. He is starting to feel improved with increased energy and decreased cough. Congestion persist. He has had no fevers He has a good appetite with no nausea, vomiting or diarrhea. He denies any bloody stools Chest x-ray shows a decreased prominence of the lung abscess in the superior segment of the left lower lobe Patient is a never smoker. Has no known dental issues with regular dental cleanings. Denies any drug or alcohol use.    Current Medications, Allergies, Complete Past Medical History, Past Surgical History, Family History, and Social History were reviewed in [employer] record.  ROS  The following are not active complaints unless bolded sore throat, dysphagia, dental problems, itching, sneezing,  nasal congestion or excess/ purulent secretions, ear ache,   fever, chills, sweats, unintended wt loss, pleuritic or exertional cp, hemoptysis,  orthopnea pnd or leg swelling, presyncope, palpitations, heartburn, abdominal pain, anorexia, nausea, vomiting, diarrhea  or change in bowel or urinary habits, change in stools or urine, dysuria,hematuria,  rash, arthralgias, visual complaints, headache, numbness weakness or ataxia or problems with walking or coordination,  change in mood/affect or memory.               Objective:   Physical Exam  07/15/2014         214>216 07/29/2014    Vital signs reviewed   HEENT: nl dentition,  turbinates, and orophanx. Nl external ear canals without cough reflex   NECK :  without JVD/Nodes/TM/ nl carotid upstrokes bilaterally   LUNGS: no acc muscle use, few crackles on left    CV:  RRR  no s3 or murmur or increase in P2, no edema   ABD:  soft and nontender with nl excursion in the supine position. No bruits or organomegaly, bowel sounds nl  MS:  warm without deformities, calf tenderness, cyanosis or clubbing  SKIN: warm and dry without lesions    NEURO:  alert, approp, no deficits     CXR PA and Lateral:   06/23/2014 :     I personally reviewed images and agree with radiology impression as follows:    Unchanged consolidation and cavitation in the superior segment left lower lobe. This could be infectious, inflammatory or neoplastic. If the patient clinically has pneumonia, continued follow-up radiography is recommended to confirm complete clearance.  07/29/2014 CXR 07/29/2014 reviewed independently Slightly decreased prominence of the lung abscess in the superior segment of the left lower lobe.  Assessment:

## 2014-07-29 NOTE — Patient Instructions (Signed)
Continue on Clindamycin .  Continue on Probiotic.  follow up Dr. Melvyn Novas  In 2 weeks with chest xray  Please contact office for sooner follow up if symptoms do not improve or worsen or seek emergency care

## 2014-07-29 NOTE — Assessment & Plan Note (Signed)
Large left lower lobe lung abscess Patient is clinically improving There is a slight improvement in abscess on chest x-ray Patient will need continue on prolonged antibiotic coverage with clindamycin  Plan  Continue on Clindamycin .  Continue on Probiotic.  follow up Dr. Melvyn Novas  In 2 weeks with chest xray  Please contact office for sooner follow up if symptoms do not improve or worsen or seek emergency care

## 2014-07-31 NOTE — Progress Notes (Signed)
Chart notes / ov reviewed as was cxr > some improvement on clindamycin which  We may need to continue up to 6 weeks

## 2014-08-10 ENCOUNTER — Telehealth: Payer: Self-pay | Admitting: Internal Medicine

## 2014-08-10 MED ORDER — CLOTRIMAZOLE 10 MG MT TROC: 10.0000 mg | Freq: Four times a day (QID) | OROMUCOSAL | Status: DC | PRN

## 2014-08-10 MED ORDER — CLINDAMYCIN HCL 300 MG PO CAPS: 300.0000 mg | ORAL_CAPSULE | Freq: Three times a day (TID) | ORAL | Status: DC

## 2014-08-10 NOTE — Telephone Encounter (Signed)
Called and spoke to pt. Informed pt of the recs per MW. Both scripts sent to preferred pharmacy. Pt verbalized understanding and denied any further questions or concerns at this time.

## 2014-08-10 NOTE — Telephone Encounter (Signed)
See additional telephone encounter from 08/10/14.

## 2014-08-10 NOTE — Telephone Encounter (Signed)
Spoke with pt. Reports that he has developed thrush in his mouth. He has Wiley spots on his tongue x1 week.  Pt also needs a refill on Clindamycin.  MW - please advise.

## 2014-08-10 NOTE — Telephone Encounter (Signed)
lmtcb x1  A duplicate message was created. Pt also needs a refill on Clindamycin.

## 2014-08-10 NOTE — Telephone Encounter (Signed)
Ok on refill x one but he should be taking 300 tid now  Clotrimazole troche 10 mg qid prn #16 refill x 5

## 2014-08-12 ENCOUNTER — Encounter: Payer: Self-pay | Admitting: Internal Medicine

## 2014-08-12 ENCOUNTER — Ambulatory Visit (INDEPENDENT_AMBULATORY_CARE_PROVIDER_SITE_OTHER)
Admission: RE | Admit: 2014-08-12 | Discharge: 2014-08-12 | Disposition: A | Discharge status: Home / Self Care | Payer: Commercial Managed Care - PPO | Source: Ambulatory Visit | Attending: Internal Medicine | Admitting: Internal Medicine

## 2014-08-12 ENCOUNTER — Ambulatory Visit (INDEPENDENT_AMBULATORY_CARE_PROVIDER_SITE_OTHER): Payer: Commercial Managed Care - PPO | Admitting: Internal Medicine

## 2014-08-12 DIAGNOSIS — J852 Abscess of lung without pneumonia: Secondary | ICD-10-CM

## 2014-08-12 NOTE — Progress Notes (Addendum)
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 Advice Only    Referred by Dr. Owens Shark lung abscess:  pt has cough with clear to foamy sl. green at times. Pt has wheezing and sob also chest tightenss/congestion.  Chronically ill x sev years worse in November 2015 baseline no inhalers but uses symbicort and both neb and alb hfa daily but then 3 weeks prior to OV   acutely ill and using symbicort ? Strength 2 every 12 h and needing rescue less and neb once  A day   Acutely ill =fever, chills, lower back pain L, increased need for inhalers preceded x 2-3 weeks by sinus infection a week off omnicef before fever developed, rx levaquin and 12 days of zpak and pred rex. Now feels  75% better with cough / congestion as main symptoms. Denies excess etoh rec pepcid 20 mg and chlortrimeton 4 mg one at bedtime GERD  Cough > mucinex dm 1200 mg every 12 hours schedule sinus CT > neg      07/15/2014 f/u ov/Andrew Roth re:  LLL Lung abscess  Chief Complaint  Patient presents with  . Acute Visit    Pt c/o increased cough for the past 3 days- prod with large amounts of green sputum. He also c/o pressure in his chest and has had low grade fever for the past wk.   while still on augmentin for the last week or so has low grade fever 99.2 s  chills/sweats had green sputum with some blood/ also assoc with sinus drainage  L back pain is some better  >>clindamycin rx   07/29/2014 Follow up : LLL lung Abscess   Patient returns for a two-week follow-up Patient has a left lower large  lung abscess. Patient had a non-resolving left-sided pneumonia after 2 antibiotics. CT chest was done showing a left lower lobe lung abscess CT sinus was negative Last visit. Patient had persistent symptoms and no change on chest x-ray. Patient was changed over to clindamycin. Patient says since last visit. He is starting to feel improved with increased energy and decreased cough. Congestion persist. He has had no fevers He has a good appetite with no nausea, vomiting or diarrhea. He denies any bloody stools Chest x-ray shows a decreased prominence of the lung abscess in the superior segment of the left lower lobe Patient is a never smoker. Has no known dental issues with regular dental cleanings. Denies any drug or alcohol use. rec Continue on Clindamycin .  Continue on Probiotic.    08/12/2014 f/u ov/Andrew Roth re: cavitary pna LLL Chief Complaint  Patient presents with  . Follow-up    CXR done today. Pt states cough is some better, but has not completely resovled. Still coughing  up minimal clear sputum. No new co's today.     No obvious day to day or daytime variabilty or assoc   cp or chest tightness, subjective wheeze overt sinus or hb symptoms. No unusual exp hx or h/o childhood pna/ asthma or knowledge of premature birth.  Sleeping ok without nocturnal  or early am exacerbation  of respiratory  c/o's or need for noct saba. Also denies any obvious fluctuation of symptoms with weather or environmental changes or other aggravating or alleviating factors except as outlined above   Current Medications, Allergies, Complete Past Medical History, Past Surgical History, Family History, and Social History were reviewed in [employer]  record.  ROS  The following are not active complaints unless bolded sore throat, dysphagia, dental problems, itching, sneezing,  nasal congestion or excess/ purulent secretions, ear ache,   fever, chills, sweats, unintended wt loss, pleuritic or exertional cp, hemoptysis,  orthopnea pnd or leg swelling, presyncope, palpitations, heartburn, abdominal pain, anorexia, nausea, vomiting, diarrhea  or change in bowel or urinary habits, change in stools or urine, dysuria,hematuria,  rash, arthralgias, visual complaints, headache, numbness weakness or ataxia or problems with walking or coordination,  change in mood/affect or memory.                    Objective:   Physical Exam  07/15/2014  214>216 07/29/2014 > 08/12/2014  218    Vital signs reviewed   HEENT: nl dentition, turbinates, and orophanx. Nl external ear canals without cough reflex   NECK :  without JVD/Nodes/TM/ nl carotid upstrokes bilaterally   LUNGS: no acc muscle use,  insp and exp rhonchi localized over LLL s egophony or amphoric changes   CV:  RRR  no s3 or murmur or increase in P2, no edema   ABD:  soft and nontender with nl excursion in the supine position. No bruits or organomegaly, bowel sounds nl  MS:  warm without deformities, calf tenderness, cyanosis or clubbing  SKIN: warm and dry without lesions    NEURO:  alert, approp, no deficits       I personally reviewed images and agree with radiology impression as follows:  CXR:  08/12/14 Cavitary mass left lower lobe unchanged from prior studies over the last 2 months. Lack of interval changes worrisome for underlying carcinoma.   Assessment:

## 2014-08-12 NOTE — Assessment & Plan Note (Addendum)
-   augmentin bid x 20 days 06/27/2014 > d/c 07/15/14 and rx clindamycin 600 tid x 5 days then 300  Qid> d/c 08/12/2014    I had an extended discussion with the patient reviewing all relevant studies completed to date and  lasting 15 to 20 minutes of a 25 minute visit on the following ongoing concerns:  1) not clear how long he's had the present problem as his hx suggests "never bounced back from pna in 2012" and our last nl cxr is from 04/2011 so his dates may be off as this cxr was done p head trauma but now has localized rhonchi on L and persistent very dense infiltrate LLL sup segment with concern re neoplasm esp carcinoid in this never smoker   2) Discussed in detail all the  indications, usual  risks and alternatives  relative to the benefits with patient who agrees to proceed with bronchoscopy with biopsy or at the very least BAL of the superior segment and consideration for segmentectomy as he has clearly failed rx for lung abscess at this point  Tentatively plan for fob 08/16/14

## 2014-08-12 NOTE — Patient Instructions (Signed)
Come to outpatient registration at [facility] (behind the ER) at 715 am Monday 08/16/14  with nothing to eat or drink after midnight Sunday.for Bronchoscopy   Stop clindamycin

## 2014-08-16 ENCOUNTER — Encounter (HOSPITAL_COMMUNITY)
Admission: RE | Disposition: A | Discharge status: Home / Self Care | Payer: Self-pay | Source: Ambulatory Visit | Attending: Internal Medicine

## 2014-08-16 ENCOUNTER — Ambulatory Visit (HOSPITAL_COMMUNITY)
Admission: RE | Admit: 2014-08-16 | Discharge: 2014-08-16 | Disposition: A | Discharge status: Home / Self Care | Payer: Commercial Managed Care - PPO | Source: Ambulatory Visit | Attending: Internal Medicine | Admitting: Internal Medicine

## 2014-08-16 ENCOUNTER — Encounter (HOSPITAL_COMMUNITY): Payer: Self-pay | Admitting: Respiratory Therapy

## 2014-08-16 DIAGNOSIS — R918 Other nonspecific abnormal finding of lung field: Secondary | ICD-10-CM | POA: Diagnosis present

## 2014-08-16 DIAGNOSIS — J852 Abscess of lung without pneumonia: Secondary | ICD-10-CM

## 2014-08-16 DIAGNOSIS — K219 Gastro-esophageal reflux disease without esophagitis: Secondary | ICD-10-CM | POA: Insufficient documentation

## 2014-08-16 HISTORY — PX: VIDEO BRONCHOSCOPY: SHX5072

## 2014-08-16 SURGERY — VIDEO BRONCHOSCOPY WITHOUT FLUORO
Anesthesia: Moderate Sedation | Laterality: Bilateral

## 2014-08-16 MED ORDER — MIDAZOLAM HCL 5 MG/ML IJ SOLN: 1.0000 mg | Freq: Once | INTRAMUSCULAR | Status: DC

## 2014-08-16 MED ORDER — MIDAZOLAM HCL 10 MG/2ML IJ SOLN
INTRAMUSCULAR | Status: AC
  Filled 2014-08-16: qty 4

## 2014-08-16 MED ORDER — SODIUM CHLORIDE 0.9 % IV SOLN
INTRAVENOUS | Status: DC
  Administered 2014-08-16: 08:00:00 via INTRAVENOUS

## 2014-08-16 MED ORDER — PHENYLEPHRINE HCL 0.25 % NA SOLN: 1.0000 | Freq: Four times a day (QID) | NASAL | Status: DC | PRN

## 2014-08-16 MED ORDER — LIDOCAINE HCL 2 % EX GEL: 1.0000 "application " | Freq: Once | CUTANEOUS | Status: DC

## 2014-08-16 MED ORDER — MEPERIDINE HCL 100 MG/ML IJ SOLN
INTRAMUSCULAR | Status: AC
  Filled 2014-08-16: qty 2

## 2014-08-16 MED ORDER — LIDOCAINE HCL 1 % IJ SOLN
INTRAMUSCULAR | Status: DC | PRN
  Administered 2014-08-16: 6 mL via RESPIRATORY_TRACT

## 2014-08-16 MED ORDER — MEPERIDINE HCL 100 MG/ML IJ SOLN: 100.0000 mg | Freq: Once | INTRAMUSCULAR | Status: DC

## 2014-08-16 MED ORDER — LIDOCAINE HCL 2 % EX GEL
CUTANEOUS | Status: DC | PRN
  Administered 2014-08-16: 1

## 2014-08-16 MED ORDER — MIDAZOLAM HCL 10 MG/2ML IJ SOLN
INTRAMUSCULAR | Status: DC | PRN
  Administered 2014-08-16 (×2): 2.5 mg via INTRAVENOUS

## 2014-08-16 MED ORDER — PHENYLEPHRINE HCL 0.25 % NA SOLN
NASAL | Status: DC | PRN
  Administered 2014-08-16: 2 via NASAL

## 2014-08-16 MED ORDER — MEPERIDINE HCL 25 MG/ML IJ SOLN
INTRAMUSCULAR | Status: DC | PRN
  Administered 2014-08-16: 50 mg via INTRAVENOUS

## 2014-08-16 NOTE — Op Note (Signed)
Bronchoscopy Procedure Note  Date of Operation: 08/16/2014   Pre-op Diagnosis: cavitary mass  Post-op Diagnosis: same  Surgeon: Christinia Gully  Anesthesia: Monitored Local Anesthesia with Sedation  Operation: Video Flexible fiberoptic bronchoscopy, diagnostic   Findings: no endobronchial dz  Specimen: BAL Sup segment LLL  Estimated Blood Loss: min  Complications: non  Indications and History: See updated H and P same date. The risks, benefits, complications, treatment options and expected outcomes were discussed with the patient.  The possibilities of reaction to medication, pulmonary aspiration, perforation of a viscus, bleeding, failure to diagnose a condition and creating a complication requiring transfusion or operation were discussed with the patient who freely signed the consent.    Description of Procedure: The patient was re-examined in the bronchoscopy suite and the site of surgery properly noted/marked.  The patient was identified  and the procedure verified as Flexible Fiberoptic Bronchoscopy.  A Time Out was held and the above information confirmed.   After the induction of topical nasopharyngeal anesthesia, the patient was positioned  and the bronchoscope was passed through the R naris. The vocal cords were visualized and  1% buffered lidocaine 5 ml was topically placed onto the cords. The cords were nl. The scope was then passed into the trachea.  1% buffered lidocaine given topically. Airways inspected bilaterally to the subsegmental level with the following findings:  1) all airways opened widely to the subsegmental level with no endobronchial dz noted    Interventions:  BAL LLL sup segment turned very bloody on second lavage, no purulent secretions noted     The Patient was taken to the Endoscopy Recovery area in satisfactory condition.  Attestation: I performed the procedure.  Christinia Gully, MD Pulmonary and [place]  [phone] After 5:30 PM or weekends, call [phone]

## 2014-08-16 NOTE — Discharge Instructions (Signed)
Flexible Bronchoscopy, Care After These instructions give you information on caring for yourself after your procedure. Your doctor may also give you more specific instructions. Call your doctor if you have any problems or questions after your procedure. HOME CARE  Do not eat or drink anything for 2 hours after your procedure. If you try to eat or drink before the medicine wears off, food or drink could go into your lungs. You could also burn yourself.  After 2 hours have passed and when you can cough and gag normally, you may eat soft food and drink liquids slowly.  The day after the test, you may eat your normal diet.  You may do your normal activities.  Keep all doctor visits. GET HELP RIGHT AWAY IF:  You get more and more short of breath.  You get light-headed.  You feel like you are going to pass out (faint).  You have chest pain.  You have new problems that worry you.  You cough up more than a little blood.  You cough up more blood than before. MAKE SURE YOU:  Understand these instructions.  Will watch your condition.  Will get help right away if you are not doing well or get worse. Document Released: 03/11/2009 Document Revised: 05/19/2013 Document Reviewed: 01/16/2013 [facility] Patient Information 2015 [place], [place]. This information is not intended to replace advice given to you by your health care provider. Make sure you discuss any questions you have with your health care provider.  Nothing to eat or drink until 10:30 am  Today  08/16/2014

## 2014-08-16 NOTE — Progress Notes (Signed)
Video Bronchoscopy done Intervention Bronchial washing done Procedure tolerated well 

## 2014-08-16 NOTE — H&P (Signed)
Brief patient profile:  75 yowm never smoker wife is RN, seasonal rhinitis on allergy shots until 2015 on albuterol prn athletics in teens only and eventually did fine s saba and even tried out for [employer] baseball worked in [employer], educational in [place] stopped in [place] 2014 with treated for outpt pna in 2012 but never the same since pna with poor ex tolerance and noct gurgling cough assoc with sinus drainage and now referred to pulmonary clinic 06/23/14 by Dr Rosario Adie in [place] with newly dx'd cavitary lesion LLL   History of Present Illness  06/23/2014 1st [facility] office visit/ Beckie Viscardi  Chief Complaint  Patient presents with  . Advice Only    Referred by Dr. Owens Shark lung abscess: pt has cough with clear to foamy sl. green at times. Pt has wheezing and sob also chest tightenss/congestion.  Chronically ill x sev years worse in November 2015 baseline no inhalers but uses symbicort and both neb and alb hfa daily but then 3 weeks prior to OV acutely ill and using symbicort ? Strength 2 every 12 h and needing rescue less and neb once A day   Acutely ill =fever, chills, lower back pain L, increased need for inhalers preceded x 2-3 weeks by sinus infection a week off omnicef before fever developed, rx levaquin and 12 days of zpak and pred rex. Now feels 75% better with cough / congestion as main symptoms. Denies excess etoh rec pepcid 20 mg and chlortrimeton 4 mg one at bedtime GERD  Cough > mucinex dm 1200 mg every 12 hours schedule sinus CT > neg     07/15/2014 f/u ov/Cornellius Kropp re: LLL Lung abscess  Chief Complaint  Patient presents with  . Acute Visit    Pt c/o increased cough for the past 3 days- prod with large amounts of green sputum. He also c/o pressure in his chest and has had low grade fever for the past wk.   while still on augmentin for the last week or so has low grade fever 99.2 s chills/sweats had green sputum with some blood/ also assoc with sinus  drainage  L back pain is some better  >>clindamycin rx   07/29/2014 Follow up : LLL lung Abscess  Patient returns for a two-week follow-up Patient has a left lower large lung abscess. Patient had a non-resolving left-sided pneumonia after 2 antibiotics. CT chest was done showing a left lower lobe lung abscess CT sinus was negative Last visit. Patient had persistent symptoms and no change on chest x-ray. Patient was changed over to clindamycin. Patient says since last visit. He is starting to feel improved with increased energy and decreased cough. Congestion persist. He has had no fevers He has a good appetite with no nausea, vomiting or diarrhea. He denies any bloody stools Chest x-ray shows a decreased prominence of the lung abscess in the superior segment of the left lower lobe Patient is a never smoker. Has no known dental issues with regular dental cleanings. Denies any drug or alcohol use. rec Continue on Clindamycin .  Continue on Probiotic.    08/12/2014 f/u ov/Kazoua Gossen re: cavitary pna LLL Chief Complaint  Patient presents with  . Follow-up    CXR done today. Pt states cough is some better, but has not completely resovled. Still coughing up minimal clear sputum. No new co's today.    No obvious day to day or daytime variabilty or assoc cp or chest tightness, subjective wheeze overt sinus or hb symptoms. No unusual exp  hx or h/o childhood pna/ asthma or knowledge of premature birth.  Sleeping ok without nocturnal or early am exacerbation of respiratory c/o's or need for noct saba. Also denies any obvious fluctuation of symptoms with weather or environmental changes or other aggravating or alleviating factors except as outlined above   Current Medications, Allergies, Complete Past Medical History, Past Surgical History, Family History, and Social History were reviewed in [employer] record.  ROS The following are not active complaints unless  bolded sore throat, dysphagia, dental problems, itching, sneezing, nasal congestion or excess/ purulent secretions, ear ache, fever, chills, sweats, unintended wt loss, pleuritic or exertional cp, hemoptysis, orthopnea pnd or leg swelling, presyncope, palpitations, heartburn, abdominal pain, anorexia, nausea, vomiting, diarrhea or change in bowel or urinary habits, change in stools or urine, dysuria,hematuria, rash, arthralgias, visual complaints, headache, numbness weakness or ataxia or problems with walking or coordination, change in mood/affect or memory.              Objective:  Physical Exam  07/15/2014 214>216 07/29/2014 > 08/12/2014 218   Vital signs reviewed   HEENT: nl dentition, turbinates, and orophanx. Nl external ear canals without cough reflex   NECK : without JVD/Nodes/TM/ nl carotid upstrokes bilaterally   LUNGS: no acc muscle use, insp and exp rhonchi localized over LLL s egophony or amphoric changes   CV: RRR no s3 or murmur or increase in P2, no edema   ABD: soft and nontender with nl excursion in the supine position. No bruits or organomegaly, bowel sounds nl  MS: warm without deformities, calf tenderness, cyanosis or clubbing  SKIN: warm and dry without lesions   NEURO: alert, approp, no deficits    CXR PA and Lateral: 06/23/2014 :  I personally reviewed images and agree with radiology impression as follows:  Unchanged consolidation and cavitation in the superior segment left lower lobe. This could be infectious, inflammatory or neoplastic. If the patient clinically has pneumonia, continued follow-up radiography is recommended to confirm complete clearance.  07/29/2014 CXR 07/29/2014 reviewed independently Slightly decreased prominence of the lung abscess in the superior segment of the left lower lobe.  Assessment:                       Lung abscess - [name], MD at 08/12/2014 10:43 AM      Status: Alison Stalling Related Problem: Lung abscess   Expand All Collapse All   - augmentin bid x 20 days 06/27/2014 > d/c 07/15/14 and rx clindamycin 600 tid x 5 days then 300 Qid> d/c 08/12/2014    I had an extended discussion with the patient reviewing all relevant studies completed to date and lasting 15 to 20 minutes of a 25 minute visit on the following ongoing concerns:  1) not clear how long he's had the present problem as his hx suggests "never bounced back from pna in 2012" and our last nl cxr is from 04/2011 so his dates may be off as this cxr was done p head trauma but now has localized rhonchi on L and persistent very dense infiltrate LLL sup segment with concern re neoplasm esp carcinoid in this never smoker   2) Discussed in detail all the indications, usual risks and alternatives relative to the benefits with patient who agrees to proceed with bronchoscopy with biopsy or at the very least BAL of the superior segment and consideration for segmentectomy as he has clearly failed rx for lung abscess at this point  Tentatively plan for fob 08/16/14           .08/16/2014 Day of FOB not bringing up any mucus now / no change hx/ exam     Christinia Gully, MD [facility] [place] [phone] After 5:30 PM or weekends, call [phone]

## 2014-08-17 ENCOUNTER — Encounter (HOSPITAL_COMMUNITY): Payer: Self-pay | Admitting: Internal Medicine

## 2014-08-18 LAB — CULTURE, RESPIRATORY: SPECIAL REQUESTS: NORMAL

## 2014-08-18 LAB — CULTURE, RESPIRATORY W GRAM STAIN

## 2014-08-18 NOTE — Progress Notes (Signed)
Quick Note:  Records faxed to PCP via Epic ______

## 2014-08-25 ENCOUNTER — Telehealth: Payer: Self-pay | Admitting: Internal Medicine

## 2014-08-25 DIAGNOSIS — J852 Abscess of lung without pneumonia: Secondary | ICD-10-CM

## 2014-08-25 NOTE — Telephone Encounter (Addendum)
Patient requesting a referral to Dr. Adele Dan (Bariatric Clinic) at [facility].  Patient says he discussed this with Dr. Melvyn Novas, will send to Dr. Melvyn Novas when he returns on Monday.  Patient aware that Dr. Melvyn Novas will not be in until Monday.

## 2014-08-29 NOTE — Telephone Encounter (Signed)
Fine with me but needs to get the lung problem addressed first - have not had any word this has been done - if so, please get discharge summary/ op note/path report.

## 2014-08-30 NOTE — Telephone Encounter (Signed)
Spoke with patient-the Dr's name given to [place] last week is the surgeon he is wanting to perform the lung surgery MW states he needs. Dr Adele Dan is also a Education officer, environmental and happens to "house" himself at the weight loss clinic.   I have placed order to [facility] to send referral. Also, the general surgery office for Dr Adele Dan has recently moved to [address] [place], [place] [address] with fax # of [phone]. This information was placed in order.   Will forward closed message to MW as Juluis Rainier that patient is being set up for his lung problem.

## 2014-08-31 ENCOUNTER — Telehealth: Payer: Self-pay | Admitting: Internal Medicine

## 2014-08-31 DIAGNOSIS — J852 Abscess of lung without pneumonia: Secondary | ICD-10-CM

## 2014-08-31 NOTE — Telephone Encounter (Signed)
Called & spoke with pt & advised that I had spoken with [facility] @Dr . Teppara's office & she states Dr. Raul Del does not do this type of surgery or see pts for this.  Pt advised he is not interested in having surgery at this time but does want to talk with a surgeon or some other provider that can offer him an alternative to surgery for now.  Pt will discuss this with his wife & contact his ins to find another provider that is in network.  He will contact our office when he has that info so we can send the Referral to that provider.  I have closed the Referral to Dr. Raul Del

## 2014-08-31 NOTE — Telephone Encounter (Signed)
Dr. Melvyn Novas do you have another recommendation for a surgeon for if/when the pt decides to proceed with the sx?

## 2014-08-31 NOTE — Telephone Encounter (Signed)
Order entered for referral to Dr. Skipper Cliche or Servando Snare.  Nothing further needed.

## 2014-08-31 NOTE — Telephone Encounter (Signed)
Dr Skipper Cliche or Servando Snare - the latter was on staff at [facility] if that helps though don't know if he's still "in network"

## 2014-08-31 NOTE — Telephone Encounter (Signed)
Be sure primary gets a copy of this communication  I strongly rec he see a Tsurgeon ASAP

## 2014-08-31 NOTE — Telephone Encounter (Signed)
Per Ivor Reining Unable to refer pt to dr teppara.

## 2014-09-06 ENCOUNTER — Encounter: Payer: Commercial Managed Care - PPO | Admitting: Thoracic Surgery (Cardiothoracic Vascular Surgery)

## 2014-09-06 ENCOUNTER — Encounter: Payer: Self-pay | Admitting: Thoracic Surgery (Cardiothoracic Vascular Surgery)

## 2014-09-06 NOTE — Progress Notes (Signed)
This encounter was created in error - please disregard.  This encounter was created in error - please disregard.

## 2014-09-10 ENCOUNTER — Telehealth: Payer: Self-pay | Admitting: *Deleted

## 2014-09-13 LAB — FUNGUS CULTURE W SMEAR
Fungal Smear: NONE SEEN
SPECIAL REQUESTS: NORMAL

## 2014-09-20 ENCOUNTER — Telehealth: Payer: Self-pay | Admitting: Internal Medicine

## 2014-09-20 NOTE — Telephone Encounter (Signed)
Spoke with pt. He would like to pick up a copy of the CT that was done in January. This will be left a the front desk. Pt is aware. Nothing further was needed.

## 2014-09-20 NOTE — Telephone Encounter (Signed)
Do you have a number for Judson Roch so that we may return her call? Thanks.

## 2014-09-20 NOTE — Telephone Encounter (Signed)
Called and spoke with Manuela Schwartz at Dr Hendrickson's office [phone] Advised that we have no disc of patient's CT - scan is in [facility] - done 06/16/14 Reports and images printed and faxed to Dr Roxan Hockey per Susan's request.  Will send to Dr Roxan Hockey as Juluis Rainier that CT scan is in [facility] and can be reviewed on there if their office has access Nothing further needed.

## 2014-09-20 NOTE — Telephone Encounter (Signed)
Spoke with Judson Roch at Dr [employer] office. States that the patient was referred for appt for surgery of LLL There is no CT in the computer of the chest, only a cxr.  Per Judson Roch, Dr Roxan Hockey is not going to want to see this patient without a CT.  There are notes from 07/29/14 appt with TP stating that CT done 07/29/2014 Follow up : LLL lung Abscess  Patient returns for a two-week follow-up Patient has a left lower large lung abscess. Patient had a non-resolving left-sided pneumonia after 2 antibiotics. CT chest was done showing a left lower lobe lung abscess CT sinus was negative Last visit. Patient had persistent symptoms and no change on chest x-ray. Patient was changed over to clindamycin. Patient says since last visit. He is starting to feel improved with increased energy and decreased cough. Congestion persist. He has had no fevers He has a good appetite with no nausea, vomiting or diarrhea. He denies any bloody stools Chest x-ray shows a decreased prominence of the lung abscess in the superior segment of the left lower lobe Patient is a never smoker. Has no known dental issues with regular dental cleanings. Denies any drug or alcohol use There is no CT in computer, only a CXR on 07/29/14 which stated that a CT needed to be done. Do not see where this was completed.  Pt appt with Dr Roxan Hockey is 09/21/14 at 12:30, CT will need to be done tomorrow morning or appt rescheduled.   Please advise Dr Melvyn Novas. Thanks.

## 2014-09-21 ENCOUNTER — Encounter: Payer: Self-pay | Admitting: Thoracic Surgery (Cardiothoracic Vascular Surgery)

## 2014-09-21 ENCOUNTER — Institutional Professional Consult (permissible substitution) (INDEPENDENT_AMBULATORY_CARE_PROVIDER_SITE_OTHER): Payer: Commercial Managed Care - PPO | Admitting: Thoracic Surgery (Cardiothoracic Vascular Surgery)

## 2014-09-21 VITALS — BP 131/86 | HR 95 | Resp 20 | Ht 70.0 in | Wt 215.0 lb

## 2014-09-21 DIAGNOSIS — J984 Other disorders of lung: Secondary | ICD-10-CM

## 2014-09-21 NOTE — Progress Notes (Signed)
PCP is Andrew Reichert, MD Referring Provider is Andrew Rockers, MD  Chief Complaint  Patient presents with  . Lung Abcess    Surgical eval, Chest CT @ [facility] 06/16/14, Video Flexible fiberoptic bronchoscopy 08/16/14    HPI: 44 year old man sent for consultation regarding a left lower lobe mass.  Andrew Roth is a 44 year old lifelong nonsmoker who has been having respiratory problems for the past 4 years. He says that 4 years ago he was diagnosed with pneumonia. He says his breathing has never been right since then. He gets short of breath. He has a chronic cough. This is productive and usually is clear. In January he had an episode with a worsening cough and fevers. A chest x-ray was done and showed left lower lobe infiltrate. He was treated with antibiotics. After treatment a chest x-ray showed a persistent infiltrate and he had a CT scan done on 06/16/2014 which showed a cavitating mass with air bronchograms in the left lower lobe. This was felt to be consistent with pneumonia with a possible developing lung abscess.  He was referred to Dr. Melvyn Roth, who did a bronchoscopy. It showed no endobronchial mass lesions. Cytology was negative for malignant cells. AFB and fungal stains were negative. Bacterial culture showed only normal flora.  He continues to have a productive cough and shortness of breath with exertion. A repeat chest x-ray in March showed a persistent infiltrate in the left lower lobe. His weight has been stable. He works in his own pressure washing business. His wife is a Marine scientist. He has 5 children. He is a lifelong nonsmoker.  Past Medical History  Diagnosis Date  . Depression   . Seasonal allergies   . Arthritis   . Asthma   . Gout     Past Surgical History  Procedure Laterality Date  . Back surgery    . Back surgery    . Video bronchoscopy Bilateral 08/16/2014    Procedure: VIDEO BRONCHOSCOPY WITHOUT FLUORO;  Surgeon: Andrew Rockers, MD;  Location: [facility];   Service: Endoscopy;  Laterality: Bilateral;  . Hernia repair    . Joint replacement      Family History  Problem Relation Age of Onset  . Rheum arthritis Mother   . Diabetes Mother   . Prostate cancer Father   . Hypertension Mother   . Cancer Father   . Heart disease Paternal Grandmother   . Heart disease Paternal Grandfather     Social History History  Substance Use Topics  . Smoking status: Never Smoker   . Smokeless tobacco: Never Used  . Alcohol Use: No    Current Outpatient Prescriptions  Medication Sig Dispense Refill  . acetaminophen (TYLENOL) 325 MG tablet Take 650 mg by mouth every 6 (six) hours as needed.    . budesonide-formoterol (SYMBICORT) 80-4.5 MCG/ACT inhaler Take 2 puffs first thing in am and then another 2 puffs about 12 hours later.    . cetirizine (ZYRTEC) 10 MG tablet Take 20 mg by mouth daily.    . DULoxetine (CYMBALTA) 60 MG capsule Take 60 mg by mouth daily.       No current facility-administered medications for this visit.    Allergies  Allergen Reactions  . Ciprofloxacin Other (See Comments)    Unknown   . Naproxen     Review of Systems  Constitutional: Negative for fever and chills.  Respiratory: Positive for cough, shortness of breath and wheezing.   Cardiovascular: Negative for chest pain and leg swelling.  Musculoskeletal: Positive for arthralgias.       History partial right knee replacement  Hematological: Negative for adenopathy. Does not bruise/bleed easily.  All other systems reviewed and are negative.   BP 131/86 mmHg  Pulse 95  Resp 20  Ht '5\' 10"'$  (1.778 m)  Wt 215 lb (97.523 kg)  BMI 30.85 kg/m2  SpO2 98% Physical Exam  Constitutional: He is oriented to person, place, and time. He appears well-developed and well-nourished. No distress.  HENT:  Head: Normocephalic and atraumatic.  Eyes: EOM are normal.  Neck: Neck supple. No tracheal deviation present.  Cardiovascular: Normal rate, regular rhythm, normal heart  sounds and intact distal pulses.  Exam reveals no gallop and no friction rub.   No murmur heard. Pulmonary/Chest: Effort normal. He has wheezes (Left side).  Abdominal: Soft. There is no tenderness.  Musculoskeletal: He exhibits no edema.  Lymphadenopathy:    He has no cervical adenopathy.  Neurological: He is alert and oriented to person, place, and time. No cranial nerve deficit.  Skin: Skin is warm and dry.  Vitals reviewed.    Diagnostic Tests: CT chest from 06/16/2014 reviewed. Findings as noted in history of present illness. There is a consolidative mass with air bronchograms in the central cavity in the left lower lobe.  Impression: 44 year old man with a cavitary mass in the left lower lobe. This was found in January in the setting of pneumonia. This had not resolved on a chest x-ray 2 months later. Most likely it is an organizing pneumonia with lung abscess, but the possibility of a neoplasm cannot be ruled out.    He has had repeated episodes of pneumonia dating back 4 years. That suggests there is some predisposition either due to endobronchial obstruction or some residual abnormality such as BOOP that serves as a nidus for recurrent infections. Bronchoscopy failed to reveal any endobronchial cause. There is no systemic arterial supply to the area on CT that would suggest a intralobar sequestration.  Given that the CT scan is now 54 months old I think we need to repeat a CT. Given the unusual nature of this case I think would be reasonable to do a PET/CT to see if there is any metabolic activity in the mass or any other areas of hypermetabolism that would need investigation.  I had a long discussion with Andrew Roth and his wife who is a Equities trader. We reviewed the differential diagnosis. We talked about potential treatments. I suspect that he will need a resection when all is said and done, but I want to see a newer scan before making that commitment.  Plan: PET/CT  Return  to office after PET/CT to discuss further management  [name], MD [facility] [phone]

## 2014-09-23 ENCOUNTER — Encounter: Payer: Self-pay | Admitting: *Deleted

## 2014-09-28 ENCOUNTER — Other Ambulatory Visit: Payer: Self-pay | Admitting: *Deleted

## 2014-09-28 ENCOUNTER — Ambulatory Visit (INDEPENDENT_AMBULATORY_CARE_PROVIDER_SITE_OTHER): Payer: Commercial Managed Care - PPO | Admitting: Thoracic Surgery (Cardiothoracic Vascular Surgery)

## 2014-09-28 ENCOUNTER — Encounter: Payer: Self-pay | Admitting: Thoracic Surgery (Cardiothoracic Vascular Surgery)

## 2014-09-28 DIAGNOSIS — R918 Other nonspecific abnormal finding of lung field: Secondary | ICD-10-CM

## 2014-09-28 DIAGNOSIS — J9811 Atelectasis: Secondary | ICD-10-CM

## 2014-09-28 DIAGNOSIS — J984 Other disorders of lung: Secondary | ICD-10-CM

## 2014-09-28 LAB — AFB CULTURE WITH SMEAR (NOT AT ARMC)
Acid Fast Smear: NONE SEEN
Special Requests: NORMAL

## 2014-09-28 NOTE — Progress Notes (Signed)
Andrew Roth       [place] [address]             [phone]       HPI: 44 year old man sent for consultation regarding a left lower lobe mass.  Andrew Roth is a 44 year old lifelong nonsmoker who has been having respiratory problems for the past 4 years. He says that 4 years ago he was diagnosed with pneumonia. He says his breathing has never been right since then. He gets short of breath. He has a chronic cough. This is productive and usually is clear. In January he had an episode with a worsening cough and fevers. A chest x-ray was done and showed left lower lobe infiltrate. He was treated with antibiotics. After treatment a chest x-ray showed a persistent infiltrate and he had a CT scan done on 06/16/2014 which showed a cavitating mass with air bronchograms in the left lower lobe. This was felt to be consistent with pneumonia with a possible developing lung abscess.  He was referred to Dr. Melvyn Novas, who did a bronchoscopy. It showed no endobronchial mass lesions. Cytology was negative for malignant cells. AFB and fungal stains were negative. Bacterial culture showed only normal flora.  He continues to have a productive cough and shortness of breath with exertion. A repeat chest x-ray in March showed a persistent infiltrate in the left lower lobe. His weight has been stable. He works in his own pressure washing business. His wife is a Marine scientist. He has 5 children. He is a lifelong nonsmoker.  Since I saw him last week his symptoms are unchanged. He returns to discuss the results of his PET/CT.  Past Medical History  Diagnosis Date  . Depression   . Seasonal allergies   . Arthritis   . Asthma   . Gout       Current Outpatient Prescriptions  Medication Sig Dispense Refill  . acetaminophen (TYLENOL) 325 MG tablet Take 650 mg by mouth every 6 (six) hours as needed.    . budesonide-formoterol (SYMBICORT) 80-4.5 MCG/ACT inhaler Take 2 puffs first thing in am and then  another 2 puffs about 12 hours later.    . cetirizine (ZYRTEC) 10 MG tablet Take 20 mg by mouth daily.    . DULoxetine (CYMBALTA) 60 MG capsule Take 60 mg by mouth daily.       No current facility-administered medications for this visit.    Physical Exam BP 132/82 mmHg  Pulse 90  Resp 16  Ht '5\' 10"'$  (1.778 m)  Wt 215 lb (97.523 kg)  BMI 30.85 kg/m2  SpO74 40% 44 year old Andrew Roth male in no acute distress Well-developed and well-nourished Alert and oriented 3 with no focal deficits No cervical or subclavicular adenopathy Cardiac regular rate and rhythm normal S1 and S2 no rubs or murmurs Lungs with the bronchial breath sounds, absent breath sounds left base No peripheral edema  Diagnostic Tests:  I personally reviewed the images and report from his PET/CT today  It shows extensive consolidation of the left lower lobe (progressed from last CT scan). The majority of this is not hypermetabolic. There is a central area of cavitation within SUV of 2.6. There is no discrete mass.  Impression: 44 year old man with persistent consolidation in the left lower lobe without any definite endobronchial mass to explain the findings. This may all be sequelae of his previous pneumonia/lung abscess. He previously had a bronchoscopy which showed no endobronchial lesions. Sampling was limited due to bleeding.  I discussed the options with Andrew Roth. The first option would be to continue to follow this radiographically and treat him when he develops pneumonia symptoms. I am not in favor of that option.  A second option would be repeat bronchoscopy, but to do it under general anesthesia to allow for more extensive sampling.  The third option would be to proceed with a left lower lobectomy. Personally I suspect this is where we end up eventually. However he is very reluctant to consider that option currently.  After discussing the options and the relative advantages and disadvantages of each approach, he  would like to proceed with repeat bronchoscopy under general anesthesia. We discussed the indications, risks, benefits, and alternatives. He understands is highly likely we will not find any definite cause. We discussed the risks associated with general anesthesia. We discussed the risks of bleeding, pneumothorax, and failure to make a diagnosis.  Plan:  Bronchoscopy under general anesthesia on Monday, 10/04/2014 [name], MD [facility] [phone]

## 2014-09-30 ENCOUNTER — Encounter (HOSPITAL_COMMUNITY): Payer: Self-pay | Admitting: *Deleted

## 2014-09-30 NOTE — Progress Notes (Signed)
Pt denies SOB, chest pain, and being under the care of a cardiologist. Pt denies having a stress test , echo and cardiac cath. Pt denies having an EKG within the last year.Pt made aware to stop taking  Aspirin., NSAID's , otc vitamins and herbal medications. Pt verbalized understanding of all pre-op instructions.

## 2014-10-01 ENCOUNTER — Encounter (HOSPITAL_COMMUNITY)
Admission: RE | Disposition: A | Discharge status: Home / Self Care | Payer: Self-pay | Source: Ambulatory Visit | Attending: Thoracic Surgery (Cardiothoracic Vascular Surgery)

## 2014-10-01 ENCOUNTER — Ambulatory Visit (HOSPITAL_COMMUNITY)
Admission: RE | Admit: 2014-10-01 | Discharge: 2014-10-01 | Disposition: A | Discharge status: Home / Self Care | Payer: Commercial Managed Care - PPO | Source: Ambulatory Visit | Attending: Thoracic Surgery (Cardiothoracic Vascular Surgery) | Admitting: Thoracic Surgery (Cardiothoracic Vascular Surgery)

## 2014-10-01 ENCOUNTER — Ambulatory Visit (HOSPITAL_COMMUNITY): Payer: Commercial Managed Care - PPO

## 2014-10-01 ENCOUNTER — Encounter (HOSPITAL_COMMUNITY): Payer: Self-pay | Admitting: *Deleted

## 2014-10-01 ENCOUNTER — Ambulatory Visit (HOSPITAL_COMMUNITY): Payer: Commercial Managed Care - PPO | Admitting: Certified Registered Nurse Anesthetist

## 2014-10-01 DIAGNOSIS — J45909 Unspecified asthma, uncomplicated: Secondary | ICD-10-CM | POA: Diagnosis not present

## 2014-10-01 DIAGNOSIS — J9811 Atelectasis: Secondary | ICD-10-CM | POA: Diagnosis not present

## 2014-10-01 DIAGNOSIS — J189 Pneumonia, unspecified organism: Secondary | ICD-10-CM | POA: Diagnosis not present

## 2014-10-01 DIAGNOSIS — J8410 Pulmonary fibrosis, unspecified: Secondary | ICD-10-CM | POA: Insufficient documentation

## 2014-10-01 DIAGNOSIS — M109 Gout, unspecified: Secondary | ICD-10-CM | POA: Insufficient documentation

## 2014-10-01 DIAGNOSIS — Z881 Allergy status to other antibiotic agents status: Secondary | ICD-10-CM | POA: Diagnosis not present

## 2014-10-01 DIAGNOSIS — F329 Major depressive disorder, single episode, unspecified: Secondary | ICD-10-CM | POA: Diagnosis not present

## 2014-10-01 DIAGNOSIS — F419 Anxiety disorder, unspecified: Secondary | ICD-10-CM | POA: Insufficient documentation

## 2014-10-01 DIAGNOSIS — Z96651 Presence of right artificial knee joint: Secondary | ICD-10-CM | POA: Insufficient documentation

## 2014-10-01 DIAGNOSIS — M1990 Unspecified osteoarthritis, unspecified site: Secondary | ICD-10-CM | POA: Insufficient documentation

## 2014-10-01 DIAGNOSIS — J984 Other disorders of lung: Secondary | ICD-10-CM | POA: Diagnosis present

## 2014-10-01 DIAGNOSIS — J181 Lobar pneumonia, unspecified organism: Secondary | ICD-10-CM | POA: Diagnosis not present

## 2014-10-01 HISTORY — PX: VIDEO BRONCHOSCOPY: SHX5072

## 2014-10-01 HISTORY — DX: Pneumonia, unspecified organism: J18.9

## 2014-10-01 HISTORY — DX: Anxiety disorder, unspecified: F41.9

## 2014-10-01 LAB — PROTIME-INR
INR: 0.94 (ref 0.00–1.49)
PROTHROMBIN TIME: 12.6 s (ref 11.6–15.2)

## 2014-10-01 LAB — COMPREHENSIVE METABOLIC PANEL
ALT: 25 U/L (ref 17–63)
AST: 20 U/L (ref 15–41)
Albumin: 3.7 g/dL (ref 3.5–5.0)
Alkaline Phosphatase: 53 U/L (ref 38–126)
Anion gap: 11 (ref 5–15)
BUN: 11 mg/dL (ref 6–20)
CALCIUM: 9.2 mg/dL (ref 8.9–10.3)
CO2: 23 mmol/L (ref 22–32)
CREATININE: 1.01 mg/dL (ref 0.61–1.24)
Chloride: 105 mmol/L (ref 101–111)
GFR calc non Af Amer: >60 mL/min (ref >60)
GLUCOSE: 108 mg/dL — AB (ref 70–99)
Potassium: 4.4 mmol/L (ref 3.5–5.1)
Sodium: 139 mmol/L (ref 135–145)
Total Bilirubin: 0.5 mg/dL (ref 0.3–1.2)
Total Protein: 5.9 g/dL — ABNORMAL LOW (ref 6.5–8.1)

## 2014-10-01 LAB — CBC
HCT: 45.6 % (ref 39.0–52.0)
Hemoglobin: 15.8 g/dL (ref 13.0–17.0)
MCH: 31.7 pg (ref 26.0–34.0)
MCHC: 34.6 g/dL (ref 30.0–36.0)
MCV: 91.4 fL (ref 78.0–100.0)
PLATELETS: 266 10*3/uL (ref 150–400)
RBC: 4.99 MIL/uL (ref 4.22–5.81)
RDW: 12.8 % (ref 11.5–15.5)
WBC: 8.2 10*3/uL (ref 4.0–10.5)

## 2014-10-01 LAB — APTT: APTT: 29 s (ref 24–37)

## 2014-10-01 SURGERY — BRONCHOSCOPY, VIDEO-ASSISTED
Anesthesia: General

## 2014-10-01 MED ORDER — LACTATED RINGERS IV SOLN
INTRAVENOUS | Status: DC | PRN
  Administered 2014-10-01 (×2): via INTRAVENOUS

## 2014-10-01 MED ORDER — ALBUTEROL SULFATE HFA 108 (90 BASE) MCG/ACT IN AERS
INHALATION_SPRAY | RESPIRATORY_TRACT | Status: AC
  Filled 2014-10-01: qty 6.7

## 2014-10-01 MED ORDER — PROPOFOL 10 MG/ML IV BOLUS
INTRAVENOUS | Status: DC | PRN
  Administered 2014-10-01: 40 mg via INTRAVENOUS
  Administered 2014-10-01: 50 mg via INTRAVENOUS
  Administered 2014-10-01 (×2): 40 mg via INTRAVENOUS
  Administered 2014-10-01: 30 mg via INTRAVENOUS
  Administered 2014-10-01: 50 mg via INTRAVENOUS
  Administered 2014-10-01: 200 mg via INTRAVENOUS
  Administered 2014-10-01: 30 mg via INTRAVENOUS
  Administered 2014-10-01: 50 mg via INTRAVENOUS

## 2014-10-01 MED ORDER — FENTANYL CITRATE (PF) 100 MCG/2ML IJ SOLN
INTRAMUSCULAR | Status: DC | PRN
  Administered 2014-10-01 (×2): 50 ug via INTRAVENOUS
  Administered 2014-10-01: 100 ug via INTRAVENOUS
  Administered 2014-10-01: 50 ug via INTRAVENOUS

## 2014-10-01 MED ORDER — NEOSTIGMINE METHYLSULFATE 10 MG/10ML IV SOLN
INTRAVENOUS | Status: DC | PRN
  Administered 2014-10-01: 3 mg via INTRAVENOUS

## 2014-10-01 MED ORDER — EPINEPHRINE HCL 1 MG/ML IJ SOLN
INTRAMUSCULAR | Status: AC
  Filled 2014-10-01: qty 1

## 2014-10-01 MED ORDER — ONDANSETRON HCL 4 MG/2ML IJ SOLN
INTRAMUSCULAR | Status: DC | PRN
  Administered 2014-10-01: 4 mg via INTRAVENOUS

## 2014-10-01 MED ORDER — ESMOLOL HCL 10 MG/ML IV SOLN
INTRAVENOUS | Status: DC | PRN
  Administered 2014-10-01: 20 mg via INTRAVENOUS

## 2014-10-01 MED ORDER — ALBUTEROL SULFATE HFA 108 (90 BASE) MCG/ACT IN AERS
INHALATION_SPRAY | RESPIRATORY_TRACT | Status: DC | PRN
  Administered 2014-10-01: 6 via RESPIRATORY_TRACT

## 2014-10-01 MED ORDER — FENTANYL CITRATE (PF) 100 MCG/2ML IJ SOLN: 25.0000 ug | INTRAMUSCULAR | Status: DC | PRN

## 2014-10-01 MED ORDER — DEXAMETHASONE SODIUM PHOSPHATE 4 MG/ML IJ SOLN
INTRAMUSCULAR | Status: DC | PRN
  Administered 2014-10-01: 8 mg via INTRAVENOUS

## 2014-10-01 MED ORDER — MIDAZOLAM HCL 5 MG/5ML IJ SOLN
INTRAMUSCULAR | Status: DC | PRN
  Administered 2014-10-01: 2 mg via INTRAVENOUS

## 2014-10-01 MED ORDER — PROPOFOL 10 MG/ML IV BOLUS
INTRAVENOUS | Status: AC
  Filled 2014-10-01: qty 20

## 2014-10-01 MED ORDER — LIDOCAINE HCL 4 % MT SOLN
OROMUCOSAL | Status: DC | PRN
Start: 2014-10-01 — End: 2014-10-01
  Administered 2014-10-01: 4 mL via TOPICAL

## 2014-10-01 MED ORDER — LIDOCAINE HCL (CARDIAC) 20 MG/ML IV SOLN
INTRAVENOUS | Status: AC
  Filled 2014-10-01: qty 10

## 2014-10-01 MED ORDER — EPINEPHRINE HCL 1 MG/ML IJ SOLN
INTRAMUSCULAR | Status: DC | PRN
  Administered 2014-10-01: 1 mg

## 2014-10-01 MED ORDER — 0.9 % SODIUM CHLORIDE (POUR BTL) OPTIME
TOPICAL | Status: DC | PRN
  Administered 2014-10-01: 1000 mL

## 2014-10-01 MED ORDER — MIDAZOLAM HCL 2 MG/2ML IJ SOLN
INTRAMUSCULAR | Status: AC
  Filled 2014-10-01: qty 2

## 2014-10-01 MED ORDER — LACTATED RINGERS IV SOLN
INTRAVENOUS | Status: DC
  Administered 2014-10-01: 08:00:00 via INTRAVENOUS

## 2014-10-01 MED ORDER — FENTANYL CITRATE (PF) 250 MCG/5ML IJ SOLN
INTRAMUSCULAR | Status: AC
  Filled 2014-10-01: qty 5

## 2014-10-01 MED ORDER — ROCURONIUM BROMIDE 50 MG/5ML IV SOLN
INTRAVENOUS | Status: AC
  Filled 2014-10-01: qty 1

## 2014-10-01 MED ORDER — ONDANSETRON HCL 4 MG/2ML IJ SOLN
INTRAMUSCULAR | Status: AC
  Filled 2014-10-01: qty 2

## 2014-10-01 MED ORDER — DEXAMETHASONE SODIUM PHOSPHATE 4 MG/ML IJ SOLN
INTRAMUSCULAR | Status: AC
  Filled 2014-10-01: qty 2

## 2014-10-01 MED ORDER — GLYCOPYRROLATE 0.2 MG/ML IJ SOLN
INTRAMUSCULAR | Status: DC | PRN
  Administered 2014-10-01: 0.4 mg via INTRAVENOUS

## 2014-10-01 MED ORDER — ROCURONIUM BROMIDE 100 MG/10ML IV SOLN
INTRAVENOUS | Status: DC | PRN
  Administered 2014-10-01: 30 mg via INTRAVENOUS
  Administered 2014-10-01: 50 mg via INTRAVENOUS

## 2014-10-01 MED ORDER — ESMOLOL HCL 10 MG/ML IV SOLN
INTRAVENOUS | Status: AC
  Filled 2014-10-01: qty 10

## 2014-10-01 MED ORDER — LIDOCAINE HCL (CARDIAC) 20 MG/ML IV SOLN
INTRAVENOUS | Status: DC | PRN
  Administered 2014-10-01: 60 mg via INTRAVENOUS

## 2014-10-01 SURGICAL SUPPLY — 29 items
BALL CTTN LRG ABS STRL LF (GAUZE/BANDAGES/DRESSINGS)
BLOCK BITE 60FR ADLT L/F BLUE (MISCELLANEOUS) ×3 IMPLANT
BRUSH CYTOL CELLEBRITY 1.5X140 (MISCELLANEOUS) ×4 IMPLANT
CANISTER SUCTION 2500CC (MISCELLANEOUS) ×3 IMPLANT
CONT SPEC 4OZ CLIKSEAL STRL BL (MISCELLANEOUS) ×17 IMPLANT
COTTONBALL LRG STERILE PKG (GAUZE/BANDAGES/DRESSINGS) IMPLANT
COVER TABLE BACK 60X90 (DRAPES) ×3 IMPLANT
FILTER STRAW FLUID ASPIR (MISCELLANEOUS) IMPLANT
FORCEPS BIOP RJ4 1.8 (CUTTING FORCEPS) IMPLANT
GAUZE SPONGE 4X4 12PLY STRL (GAUZE/BANDAGES/DRESSINGS) ×3 IMPLANT
GLOVE BIOGEL PI IND STRL 7.0 (GLOVE) IMPLANT
GLOVE BIOGEL PI INDICATOR 7.0 (GLOVE) ×2
GLOVE SURG SIGNA 7.5 PF LTX (GLOVE) ×3 IMPLANT
KIT CLEAN ENDO COMPLIANCE (KITS) ×3 IMPLANT
KIT ROOM TURNOVER OR (KITS) ×3 IMPLANT
NDL BIOPSY TRANSBRONCH 21G (NEEDLE) IMPLANT
NEEDLE 22X1 1/2 (OR ONLY) (NEEDLE) IMPLANT
NEEDLE BIOPSY TRANSBRONCH 21G (NEEDLE) IMPLANT
NS IRRIG 1000ML POUR BTL (IV SOLUTION) ×3 IMPLANT
PAD ARMBOARD 7.5X6 YLW CONV (MISCELLANEOUS) ×6 IMPLANT
SOLUTION ANTI FOG 6CC (MISCELLANEOUS) ×3 IMPLANT
SYR 20ML ECCENTRIC (SYRINGE) ×3 IMPLANT
SYR 5ML LL (SYRINGE) ×3 IMPLANT
SYR 5ML LUER SLIP (SYRINGE) ×3 IMPLANT
SYR CONTROL 10ML LL (SYRINGE) IMPLANT
TOWEL OR 17X24 6PK STRL BLUE (TOWEL DISPOSABLE) ×3 IMPLANT
TRAP SPECIMEN MUCOUS 40CC (MISCELLANEOUS) ×6 IMPLANT
TUBE CONNECTING 20'X1/4 (TUBING) ×1
TUBE CONNECTING 20X1/4 (TUBING) ×2 IMPLANT

## 2014-10-01 NOTE — Anesthesia Postprocedure Evaluation (Signed)
  Anesthesia Post-op Note  Patient: Andrew Roth  Procedure(s) Performed: Procedure(s): VIDEO BRONCHOSCOPY (N/A)  Patient Location: PACU  Anesthesia Type:General  Level of Consciousness: awake  Airway and Oxygen Therapy: Patient Spontanous Breathing  Post-op Pain: none  Post-op Assessment: Post-op Vital signs reviewed, Patient's Cardiovascular Status Stable, Respiratory Function Stable, Patent Airway, No signs of Nausea or vomiting and Pain level controlled  Post-op Vital Signs: Reviewed and stable  Last Vitals:  Filed Vitals:   10/01/14 1130  BP:   Pulse: 107  Temp:   Resp: 24    Complications: No apparent anesthesia complications

## 2014-10-01 NOTE — Anesthesia Procedure Notes (Signed)
Procedure Name: Intubation Date/Time: 10/01/2014 9:12 AM Performed by: Garrison Columbus T Pre-anesthesia Checklist: Emergency Drugs available, Patient identified, Suction available and Patient being monitored Patient Re-evaluated:Patient Re-evaluated prior to inductionOxygen Delivery Method: Circle system utilized Preoxygenation: Pre-oxygenation with 100% oxygen Intubation Type: IV induction Ventilation: Mask ventilation without difficulty, Oral airway inserted - appropriate to patient size and Two handed mask ventilation required Laryngoscope Size: Miller and 2 Grade View: Grade I Tube type: Oral Tube size: 8.5 mm Number of attempts: 1 Airway Equipment and Method: Stylet,  LTA kit utilized and Oral airway Placement Confirmation: ETT inserted through vocal cords under direct vision,  positive ETCO2 and breath sounds checked- equal and bilateral Secured at: 22 cm Tube secured with: Tape Dental Injury: Teeth and Oropharynx as per pre-operative assessment

## 2014-10-01 NOTE — Interval H&P Note (Signed)
History and Physical Interval Note:  10/01/2014 8:19 AM  Andrew Roth  has presented today for surgery, with the diagnosis of LLL ATALECTASIS  The various methods of treatment have been discussed with the patient and family. After consideration of risks, benefits and other options for treatment, the patient has consented to  Procedure(s): VIDEO BRONCHOSCOPY (N/A) as a surgical intervention .  The patient's history has been reviewed, patient examined, no change in status, stable for surgery.  I have reviewed the patient's chart and labs.  Questions were answered to the patient's satisfaction.     [name]

## 2014-10-01 NOTE — Anesthesia Preprocedure Evaluation (Signed)
Anesthesia Evaluation  Patient identified by MRN, date of birth, ID band Patient awake    Reviewed: Allergy & Precautions, NPO status , Patient's Chart, lab work & pertinent test results  History of Anesthesia Complications Negative for: history of anesthetic complications  Airway Mallampati: II  TM Distance: >3 FB Neck ROM: Full    Dental  (+) Teeth Intact   Pulmonary asthma , neg sleep apnea, pneumonia -, resolved, neg recent URI,  breath sounds clear to auscultation        Cardiovascular negative cardio ROS  Rhythm:Regular     Neuro/Psych PSYCHIATRIC DISORDERS Anxiety Depression negative neurological ROS     GI/Hepatic negative GI ROS, Neg liver ROS,   Endo/Other    Renal/GU negative Renal ROS     Musculoskeletal  (+) Arthritis -,   Abdominal   Peds  Hematology negative hematology ROS (+)   Anesthesia Other Findings   Reproductive/Obstetrics                             Anesthesia Physical Anesthesia Plan  ASA: II  Anesthesia Plan: General   Post-op Pain Management:    Induction: Intravenous  Airway Management Planned: Oral ETT  Additional Equipment: None  Intra-op Plan:   Post-operative Plan: Extubation in OR  Informed Consent: I have reviewed the patients History and Physical, chart, labs and discussed the procedure including the risks, benefits and alternatives for the proposed anesthesia with the patient or authorized representative who has indicated his/her understanding and acceptance.   Dental advisory given  Plan Discussed with: CRNA and Surgeon  Anesthesia Plan Comments:         Anesthesia Quick Evaluation

## 2014-10-01 NOTE — Transfer of Care (Signed)
Immediate Anesthesia Transfer of Care Note  Patient: Andrew Roth  Procedure(s) Performed: Procedure(s): VIDEO BRONCHOSCOPY (N/A)  Patient Location: PACU  Anesthesia Type:General  Level of Consciousness: patient cooperative and responds to stimulation  Airway & Oxygen Therapy: Patient Spontanous Breathing and Patient connected to nasal cannula oxygen  Post-op Assessment: Report given to RN and Post -op Vital signs reviewed and stable  Post vital signs: Reviewed and stable  Last Vitals:  Filed Vitals:   10/01/14 0635  BP: 144/86  Pulse: 88  Temp: 36.4 C  Resp: 18    Complications: No apparent anesthesia complications

## 2014-10-01 NOTE — Brief Op Note (Signed)
10/01/2014  10:43 AM  PATIENT:  Andrew Roth  44 y.o. male  PRE-OPERATIVE DIAGNOSIS:  LEFT LOWER LOBE CONSOLIDATION   POST-OPERATIVE DIAGNOSIS:  LEFT LOWER LOBE CONSOLIDATION   PROCEDURE:  Procedure(s): VIDEO BRONCHOSCOPY (N/A)- BRUSHINGS, BIOPSIES AND BAL  SURGEON:  Surgeon(s) and Role:    * [name], MD - Primary   ANESTHESIA:   general  EBL:  Total I/O In: 1000 [I.V.:1000] Out: -   BLOOD ADMINISTERED:none  DRAINS: none   LOCAL MEDICATIONS USED:  NONE  SPECIMEN:  Source of Specimen:  LEFT LOWER LOBE  DISPOSITION OF SPECIMEN:  PATH AND MICRO  PLAN OF CARE: Discharge to home after PACU  PATIENT DISPOSITION:  PACU - hemodynamically stable.   Delay start of Pharmacological VTE agent (>24hrs) due to surgical blood loss or risk of bleeding: not applicable  FINDINGS: NO ENDOBRONCHIAL LESIONS SEEN, BLEEDING WITH BRUSHINGS AND BIOPSIES

## 2014-10-01 NOTE — H&P (View-Only) (Signed)
PCP is Quintella Reichert, MD Referring Provider is [name], MD  Chief Complaint  Patient presents with  . Lung Abcess    Surgical eval, Chest CT @ [facility] 06/16/14, Video Flexible fiberoptic bronchoscopy 08/16/14    HPI: 44 year old man sent for consultation regarding a left lower lobe mass.  Andrew Roth is a 44 year old lifelong nonsmoker who has been having respiratory problems for the past 4 years. He says that 4 years ago he was diagnosed with pneumonia. He says his breathing has never been right since then. He gets short of breath. He has a chronic cough. This is productive and usually is clear. In January he had an episode with a worsening cough and fevers. A chest x-ray was done and showed left lower lobe infiltrate. He was treated with antibiotics. After treatment a chest x-ray showed a persistent infiltrate and he had a CT scan done on 06/16/2014 which showed a cavitating mass with air bronchograms in the left lower lobe. This was felt to be consistent with pneumonia with a possible developing lung abscess.  He was referred to Dr. Melvyn Novas, who did a bronchoscopy. It showed no endobronchial mass lesions. Cytology was negative for malignant cells. AFB and fungal stains were negative. Bacterial culture showed only normal flora.  He continues to have a productive cough and shortness of breath with exertion. A repeat chest x-ray in March showed a persistent infiltrate in the left lower lobe. His weight has been stable. He works in his own pressure washing business. His wife is a Marine scientist. He has 5 children. He is a lifelong nonsmoker.  Past Medical History  Diagnosis Date  . Depression   . Seasonal allergies   . Arthritis   . Asthma   . Gout     Past Surgical History  Procedure Laterality Date  . Back surgery    . Back surgery    . Video bronchoscopy Bilateral 08/16/2014    Procedure: VIDEO BRONCHOSCOPY WITHOUT FLUORO;  Surgeon: [name], MD;  Location: [facility];   Service: Endoscopy;  Laterality: Bilateral;  . Hernia repair    . Joint replacement      Family History  Problem Relation Age of Onset  . Rheum arthritis Mother   . Diabetes Mother   . Prostate cancer Father   . Hypertension Mother   . Cancer Father   . Heart disease Paternal Grandmother   . Heart disease Paternal Grandfather     Social History History  Substance Use Topics  . Smoking status: Never Smoker   . Smokeless tobacco: Never Used  . Alcohol Use: No    Current Outpatient Prescriptions  Medication Sig Dispense Refill  . acetaminophen (TYLENOL) 325 MG tablet Take 650 mg by mouth every 6 (six) hours as needed.    . budesonide-formoterol (SYMBICORT) 80-4.5 MCG/ACT inhaler Take 2 puffs first thing in am and then another 2 puffs about 12 hours later.    . cetirizine (ZYRTEC) 10 MG tablet Take 20 mg by mouth daily.    . DULoxetine (CYMBALTA) 60 MG capsule Take 60 mg by mouth daily.       No current facility-administered medications for this visit.    Allergies  Allergen Reactions  . Ciprofloxacin Other (See Comments)    Unknown   . Naproxen     Review of Systems  Constitutional: Negative for fever and chills.  Respiratory: Positive for cough, shortness of breath and wheezing.   Cardiovascular: Negative for chest pain and leg swelling.  Musculoskeletal: Positive for arthralgias.       History partial right knee replacement  Hematological: Negative for adenopathy. Does not bruise/bleed easily.  All other systems reviewed and are negative.   BP 131/86 mmHg  Pulse 95  Resp 20  Ht '5\' 10"'$  (1.778 m)  Wt 215 lb (97.523 kg)  BMI 30.85 kg/m2  SpO2 98% Physical Exam  Constitutional: He is oriented to person, place, and time. He appears well-developed and well-nourished. No distress.  HENT:  Head: Normocephalic and atraumatic.  Eyes: EOM are normal.  Neck: Neck supple. No tracheal deviation present.  Cardiovascular: Normal rate, regular rhythm, normal heart  sounds and intact distal pulses.  Exam reveals no gallop and no friction rub.   No murmur heard. Pulmonary/Chest: Effort normal. He has wheezes (Left side).  Abdominal: Soft. There is no tenderness.  Musculoskeletal: He exhibits no edema.  Lymphadenopathy:    He has no cervical adenopathy.  Neurological: He is alert and oriented to person, place, and time. No cranial nerve deficit.  Skin: Skin is warm and dry.  Vitals reviewed.    Diagnostic Tests: CT chest from 06/16/2014 reviewed. Findings as noted in history of present illness. There is a consolidative mass with air bronchograms in the central cavity in the left lower lobe.  Impression: 44 year old man with a cavitary mass in the left lower lobe. This was found in January in the setting of pneumonia. This had not resolved on a chest x-ray 2 months later. Most likely it is an organizing pneumonia with lung abscess, but the possibility of a neoplasm cannot be ruled out.    He has had repeated episodes of pneumonia dating back 4 years. That suggests there is some predisposition either due to endobronchial obstruction or some residual abnormality such as BOOP that serves as a nidus for recurrent infections. Bronchoscopy failed to reveal any endobronchial cause. There is no systemic arterial supply to the area on CT that would suggest a intralobar sequestration.  Given that the CT scan is now 57 months old I think we need to repeat a CT. Given the unusual nature of this case I think would be reasonable to do a PET/CT to see if there is any metabolic activity in the mass or any other areas of hypermetabolism that would need investigation.  I had a long discussion with [name] and his wife who is a Equities trader. We reviewed the differential diagnosis. We talked about potential treatments. I suspect that he will need a resection when all is said and done, but I want to see a newer scan before making that commitment.  Plan: PET/CT  Return  to office after PET/CT to discuss further management  [name], MD [facility] [phone]

## 2014-10-01 NOTE — Discharge Instructions (Addendum)
Do not drive or engage in heavy physical activity for 24 hours  You may resume normal activities tomorrow  You may cough up small amounts of blood over the next few days.  Call [phone] if you develop chest pain, shortness of breath, fever > 101 or cough up large amounts of blood  My office will contact you with a follow up appointment   Flexible Bronchoscopy, Care After These instructions give you information on caring for yourself after your procedure. Your doctor may also give you more specific instructions. Call your doctor if you have any problems or questions after your procedure. HOME CARE  Do not eat or drink anything for 2 hours after your procedure. If you try to eat or drink before the medicine wears off, food or drink could go into your lungs. You could also burn yourself.  After 2 hours have passed and when you can cough and gag normally, you may eat soft food and drink liquids slowly.  The day after the test, you may eat your normal diet.  You may do your normal activities.  Keep all doctor visits. GET HELP RIGHT AWAY IF:  You get more and more short of breath.  You get light-headed.  You feel like you are going to pass out (faint).  You have chest pain.  You have new problems that worry you.  You cough up more than a little blood.  You cough up more blood than before. MAKE SURE YOU:  Understand these instructions.  Will watch your condition.  Will get help right away if you are not doing well or get worse. Document Released: 03/11/2009 Document Revised: 05/19/2013 Document Reviewed: 01/16/2013 [facility] Patient Information 2015 [place], [place]. This information is not intended to replace advice given to you by your health care provider. Make sure you discuss any questions you have with your health care provider.

## 2014-10-02 NOTE — Op Note (Signed)
NAME:  COTTON, BECKLEY                ACCOUNT NO.:  [id number]  MEDICAL RECORD NO.:  [id number]  LOCATION:  [facility]                         FACILITY:  [facility]  PHYSICIAN:  Revonda Standard. Roxan Hockey, M.D.DATE OF BIRTH:  06/10/1970  DATE OF PROCEDURE:  10/01/2014 DATE OF DISCHARGE:  10/01/2014                              OPERATIVE REPORT   PREOPERATIVE DIAGNOSIS:  Left lower lobe consolidation.  POSTOPERATIVE DIAGNOSIS:  Left lower lobe consolidation.  PROCEDURE:  Video bronchoscopy with brushings, biopsies and bronchoalveolar lavage.  SURGEON:  Revonda Standard. Roxan Hockey, MD  ASSISTANT:  None.  ANESTHESIA:  General.  FINDINGS:  Normal endobronchial anatomy to the level of the subsegmental bronchi.  No endobronchial lesion seen.  Bleeding with brushings and biopsies.  CLINICAL NOTE:  Mr. Stanwood is a 44 year old man with a 4-year history of respiratory problems.  He was initially diagnosed with pneumonia and has had continued problems since then.  In January, he had worsening cough and fevers.  A chest x-ray showed a left lower lobe infiltrate.  He was treated with antibiotics, but followup chest x-ray showed a persistent infiltrate and a CT in January showed a cavitating mass with air bronchograms.  A bronchoscopy was done.  Cytology and cultures were all negative.  He was followed up with a chest x-ray two months later which showed no improvement.  He was referred for possible surgical resection, but was not in favor of doing so.  A PET-CT was done to see if there had been any change and in fact, the consolidation had worsened.  There was some hypermetabolic activity in areas of central cavitation, although the SUV was only 2.6.  He still was resistant to the idea of surgical resection.  We discussed the option of bronchoscopy under general anesthesia to get more extensive biopsies and cultures.  The indications, risks, benefits and alternatives were discussed in detail with the patient.   He understood and accepted the risks and wished to proceed.  OPERATIVE NOTE:  Mr. Postlewait was brought to the operating room on Oct 01, 2014.  He was anesthetized and intubated.  Flexible fiberoptic bronchoscopy was performed via the endotracheal tube.  It revealed normal endobronchial anatomy and no endobronchial lesions to the level of the subsegmental bronchi.  The initial examination was done with a 2 mm scope.  The 2.8 mm scope then was used for the biopsies and brushings.  Brushings were taken from the subsegmental bronchi of the left lower lobe superior segment as well as the posterior segment.  Each subsegmental bronchus was sampled individually, at least 2 brushings were obtained from each of the subsegmental bronchi and then biopsies were performed as well.  Again at least 2 biopsies were taken from each of the subsegmental bronchi.  There was bleeding with sampling that was controlled with dilute epinephrine.  Bronchoalveolar lavage was performed with 50 mL of saline.  The specimens again were sent both for pathology and for AFB fungal and bacterial cultures.  A final inspection was made and there was no ongoing bleeding.  The bronchoscope was withdrawn. The patient was extubated in the operating room and taken to the postanesthetic care unit in good condition.  Revonda Standard Roxan Hockey, M.D.     SCH/MEDQ  D:  10/01/2014  T:  10/02/2014  Job:  [id number]

## 2014-10-03 LAB — CULTURE, RESPIRATORY: CULTURE: NO GROWTH

## 2014-10-03 LAB — CULTURE, RESPIRATORY W GRAM STAIN

## 2014-10-04 ENCOUNTER — Encounter (HOSPITAL_COMMUNITY): Payer: Self-pay | Admitting: Thoracic Surgery (Cardiothoracic Vascular Surgery)

## 2014-10-04 LAB — WOUND CULTURE
CULTURE: NO GROWTH
GRAM STAIN: NONE SEEN

## 2014-10-04 LAB — TISSUE CULTURE: CULTURE: NO GROWTH

## 2014-10-07 ENCOUNTER — Encounter: Payer: Self-pay | Admitting: Thoracic Surgery (Cardiothoracic Vascular Surgery)

## 2014-10-07 ENCOUNTER — Ambulatory Visit (INDEPENDENT_AMBULATORY_CARE_PROVIDER_SITE_OTHER): Payer: Commercial Managed Care - PPO | Admitting: Thoracic Surgery (Cardiothoracic Vascular Surgery)

## 2014-10-07 VITALS — BP 150/98 | HR 100 | Resp 20 | Ht 70.0 in | Wt 214.0 lb

## 2014-10-07 DIAGNOSIS — R918 Other nonspecific abnormal finding of lung field: Secondary | ICD-10-CM | POA: Diagnosis not present

## 2014-10-07 DIAGNOSIS — J984 Other disorders of lung: Secondary | ICD-10-CM

## 2014-10-07 NOTE — Progress Notes (Signed)
[address]       [place] [address]             [phone]       HPI:  Andrew Roth returns to discuss the results of his bronchoscopy.  He is a 44 year old lifelong nonsmoker who has been having respiratory problems for the past 4 years. He says that 4 years ago he was diagnosed with pneumonia.  He has a chronic cough and shortness of breath. The cough is productive and usually is clear. In January he had an episode with a worsening cough and fevers. A chest x-ray was done and showed left lower lobe infiltrate. He was treated with antibiotics. His symptoms improved with antibiotics, but after treatment a chest x-ray showed a persistent infiltrate. ACT scan done on 06/16/2014 showed a cavitating mass with air bronchograms in the left lower lobe. This was felt to be consistent with pneumonia with a possible developing lung abscess.  He was referred to Dr. Melvyn Novas, who did a bronchoscopy. It showed no endobronchial mass lesions. Cytology was negative for malignant cells. AFB and fungal stains were negative. Bacterial culture showed only normal flora.  He continues to have a productive cough and shortness of breath with exertion. A repeat chest x-ray in March showed a persistent infiltrate in the left lower lobe. His weight has been stable. He works in his own pressure washing business. His wife is a Marine scientist. He has 5 children. He is a lifelong nonsmoker.  A PET/CT was done which showed progression of the consolidative process of the left lower lobe. There was a cavitary area with moderate hypermetabolic activity. No definite tumor was seen.  I did a repeat bronchoscopy last week. There were no endobronchial mass lesions. Multiple biopsies, brushings, and aspirations were performed both for pathology and microbiology.  Past Medical History  Diagnosis Date  . Depression   . Seasonal allergies   . Arthritis   . Asthma   . Gout   . Pneumonia   . Anxiety     Current  Outpatient Prescriptions  Medication Sig Dispense Refill  . acetaminophen (TYLENOL) 325 MG tablet Take 650 mg by mouth every 6 (six) hours as needed for moderate pain.     . budesonide-formoterol (SYMBICORT) 80-4.5 MCG/ACT inhaler Take 2 puffs first thing in am and then another 2 puffs about 12 hours later. (Patient taking differently: Inhale 2 puffs into the lungs every 12 (twelve) hours. Take 2 puffs first thing in am and then another 2 puffs about 12 hours later.)    . cetirizine (ZYRTEC) 10 MG tablet Take 20 mg by mouth daily.    . DULoxetine (CYMBALTA) 60 MG capsule Take 60 mg by mouth daily.       No current facility-administered medications for this visit.    Physical Exam BP 150/98 mmHg  Pulse 100  Resp 20  Ht '5\' 10"'$  (1.778 m)  Wt 214 lb (97.07 kg)  BMI 30.71 kg/m2  SpO2 98% Well-appearing 44 year old man in no acute distress Alert and oriented 3 with no focal deficits Cardiac regular rate and rhythm Lungs with rhonchi on left  Diagnostic Tests: Cultures to date no growth, no AFB or fungus on stains Pathology showed chronic inflammation. Single focus of highly atypical cells.  Impression: Andrew Roth is a 4 year old man with a 4 year history of chronic respiratory issues. He has a cavitary area in his left lower lobe with consolidation of the majority of the  lobe seen on chest x-ray and CT. There is no endobronchial obstruction to relieve that would improve the situation. I suspect that had destruction of the central area of the lobe with his original infection and that his left him susceptible to repeat infections. Given the progression over time there is reason to believe that he will continue to be at risk for repeated pneumonias.  My recommendation was to proceed with a left VATS and lower lobectomy to remove the diseased portion of the lung. We had a long discussion regarding this issue. We certainly do not take the recommendation for a lobectomy lightly, I just do not see  any other good option in his case. I don't think he will have a major impact on his respiratory status compared to his current status given the majority of the lobe is involved with this process. I suspect that the highly atypical cells seen on pathology are more likely a reflection of the severity of the inflammatory process rather than evidence of malignancy, but malignancy cannot be definitively ruled out.  I described the proposed procedure to him in detail. We discussed the general nature of the operation, the incisions to be used, the need for general anesthesia, the use of chest tubes postoperatively, expected hospital stay, and overall recovery. I reviewed the indications, risks, benefits, and alternatives. He understands the risks include, but are not limited to death, MI, DVT, PE, bleeding, possible need for transfusion, infection, prolonged air leak, chronic pain, cardiac arrhythmias, as well as the possibility of unforeseeable, complications.  We did discuss the use of cryo-analgesia of the intercostal nerves to help with postoperative pain control. We discussed the risks and benefits of that as well.  At this point he is anxious to proceed with surgery. He previously had been reluctant, but discussions with his wife is a nurse have convinced him to proceed. He has a trip coming up to [facility] in about 3 weeks and wants to wait until after that to have surgery done. I will be gone the following week, so we have tentatively scheduled his surgery for July 6.  Plan: Left VATS, left lower lobectomy, crow analgesia of intercostal nerves on 12/01/2014  [name], MD [facility] [phone]

## 2014-10-08 ENCOUNTER — Other Ambulatory Visit: Payer: Self-pay | Admitting: *Deleted

## 2014-10-08 DIAGNOSIS — J181 Lobar pneumonia, unspecified organism: Secondary | ICD-10-CM

## 2014-10-12 ENCOUNTER — Ambulatory Visit: Payer: Commercial Managed Care - PPO | Admitting: Thoracic Surgery (Cardiothoracic Vascular Surgery)

## 2014-10-29 LAB — FUNGUS CULTURE W SMEAR
FUNGAL SMEAR: NONE SEEN
Fungal Smear: NONE SEEN
Fungal Smear: NONE SEEN

## 2014-11-14 LAB — AFB CULTURE WITH SMEAR (NOT AT ARMC)
ACID FAST SMEAR: NONE SEEN
Acid Fast Smear: NONE SEEN
Acid Fast Smear: NONE SEEN

## 2014-11-30 ENCOUNTER — Other Ambulatory Visit: Payer: Self-pay

## 2014-11-30 ENCOUNTER — Encounter (HOSPITAL_COMMUNITY)
Admission: RE | Admit: 2014-11-30 | Discharge: 2014-11-30 | Disposition: A | Discharge status: Home / Self Care | Payer: Commercial Managed Care - PPO | Source: Ambulatory Visit | Attending: Thoracic Surgery (Cardiothoracic Vascular Surgery) | Admitting: Thoracic Surgery (Cardiothoracic Vascular Surgery)

## 2014-11-30 ENCOUNTER — Encounter (HOSPITAL_COMMUNITY): Payer: Self-pay

## 2014-11-30 VITALS — BP 145/96 | HR 76 | Temp 98.2°F | Resp 20 | Ht 70.0 in | Wt 212.6 lb

## 2014-11-30 DIAGNOSIS — J181 Lobar pneumonia, unspecified organism: Secondary | ICD-10-CM

## 2014-11-30 HISTORY — DX: Cough, unspecified: R05.9

## 2014-11-30 HISTORY — DX: Reserved for inherently not codable concepts without codable children: IMO0001

## 2014-11-30 HISTORY — DX: Type 2 diabetes mellitus without complications: E11.9

## 2014-11-30 HISTORY — DX: Gastro-esophageal reflux disease without esophagitis: K21.9

## 2014-11-30 HISTORY — DX: Other specified postprocedural states: Z98.890

## 2014-11-30 HISTORY — DX: Personal history of other diseases of the respiratory system: Z87.09

## 2014-11-30 HISTORY — DX: Cough: R05

## 2014-11-30 HISTORY — DX: Pain in unspecified joint: M25.50

## 2014-11-30 HISTORY — DX: Nausea with vomiting, unspecified: R11.2

## 2014-11-30 HISTORY — DX: Personal history of other diseases of the musculoskeletal system and connective tissue: Z87.39

## 2014-11-30 LAB — URINALYSIS, ROUTINE W REFLEX MICROSCOPIC
BILIRUBIN URINE: NEGATIVE
Glucose, UA: NEGATIVE mg/dL
Hgb urine dipstick: NEGATIVE
KETONES UR: NEGATIVE mg/dL
Leukocytes, UA: NEGATIVE
Nitrite: NEGATIVE
PH: 5 (ref 5.0–8.0)
Protein, ur: NEGATIVE mg/dL
Specific Gravity, Urine: 1.017 (ref 1.005–1.030)
UROBILINOGEN UA: 0.2 mg/dL (ref 0.0–1.0)

## 2014-11-30 LAB — BLOOD GAS, ARTERIAL
ALLENS TEST (PASS/FAIL): POSITIVE — AB
Acid-base deficit: 1 mmol/L (ref 0.0–2.0)
BICARBONATE: 22 meq/L (ref 20.0–24.0)
Drawn by: 421801
FIO2: 0.21 %
O2 SAT: 84.6 %
PATIENT TEMPERATURE: 98.6
TCO2: 22.9 mmol/L (ref 0–100)
pCO2 arterial: 29.5 mmHg — ABNORMAL LOW (ref 35.0–45.0)
pH, Arterial: 7.485 — ABNORMAL HIGH (ref 7.350–7.450)
pO2, Arterial: 44.5 mmHg — ABNORMAL LOW (ref 80.0–100.0)

## 2014-11-30 LAB — COMPREHENSIVE METABOLIC PANEL
ALT: 26 U/L (ref 17–63)
ANION GAP: 8 (ref 5–15)
AST: 25 U/L (ref 15–41)
Albumin: 4.1 g/dL (ref 3.5–5.0)
Alkaline Phosphatase: 63 U/L (ref 38–126)
BUN: 9 mg/dL (ref 6–20)
CALCIUM: 9.3 mg/dL (ref 8.9–10.3)
CO2: 26 mmol/L (ref 22–32)
CREATININE: 0.98 mg/dL (ref 0.61–1.24)
Chloride: 103 mmol/L (ref 101–111)
GFR calc non Af Amer: >60 mL/min (ref >60)
Glucose, Bld: 96 mg/dL (ref 65–99)
Potassium: 4.1 mmol/L (ref 3.5–5.1)
Sodium: 137 mmol/L (ref 135–145)
TOTAL PROTEIN: 7.1 g/dL (ref 6.5–8.1)
Total Bilirubin: 0.9 mg/dL (ref 0.3–1.2)

## 2014-11-30 LAB — APTT: aPTT: 27 seconds (ref 24–37)

## 2014-11-30 LAB — SURGICAL PCR SCREEN
MRSA, PCR: NEGATIVE
STAPHYLOCOCCUS AUREUS: NEGATIVE

## 2014-11-30 LAB — CBC
HCT: 44.7 % (ref 39.0–52.0)
Hemoglobin: 15.9 g/dL (ref 13.0–17.0)
MCH: 31 pg (ref 26.0–34.0)
MCHC: 35.6 g/dL (ref 30.0–36.0)
MCV: 87.1 fL (ref 78.0–100.0)
Platelets: 241 10*3/uL (ref 150–400)
RBC: 5.13 MIL/uL (ref 4.22–5.81)
RDW: 12.7 % (ref 11.5–15.5)
WBC: 7.3 10*3/uL (ref 4.0–10.5)

## 2014-11-30 LAB — TYPE AND SCREEN
ABO/RH(D): O POS
ANTIBODY SCREEN: NEGATIVE

## 2014-11-30 LAB — GLUCOSE, CAPILLARY: GLUCOSE-CAPILLARY: 95 mg/dL (ref 65–99)

## 2014-11-30 LAB — ABO/RH: ABO/RH(D): O POS

## 2014-11-30 LAB — PROTIME-INR
INR: 0.97 (ref 0.00–1.49)
PROTHROMBIN TIME: 13.1 s (ref 11.6–15.2)

## 2014-11-30 NOTE — Progress Notes (Addendum)
Cardiologist denies having on e  Medical Md is Dr.Mallard Fidel with [facility] in [place]  Echo denies ever having one  Stress test denies ever having one  Heart cath denies ever having one  EKG denies in past month  CXR denies in past 72 hrs

## 2014-11-30 NOTE — Pre-Procedure Instructions (Signed)
Andrew Roth  11/30/2014      [facility] [address], [place] - [address] [place] [address] Phone: [phone] Fax: [phone]  [place], [place] - [address] [place] [address] Phone: [phone] Fax: [phone]  [address] - [place], [address] [place] [address] Phone: [phone] Fax: [phone]    Your procedure is scheduled on Wed, July 6 @ 8:30 AM  Report to [facility] Admitting at 6:30 AM.  Call this number if you have problems the morning of surgery:  [phone]   Remember:  Do not eat food or drink liquids after midnight.  Take these medicines the morning of surgery with A SIP OF WATER:Cymbalta(Duloxetine),Zyrtec(Cetirizine),and Symbicort<Bring Your Inhaler With You>               No Goody's,BC's,Aleve,Aspirin,Ibuprofen,Fish Oil,or any Herbal Medications.    Do not wear jewelry.  Do not wear lotions, powders, or colognes.               Men may shave face and neck.  Do not bring valuables to the hospital.  [facility] is not responsible for any belongings or valuables.  Contacts, dentures or bridgework may not be worn into surgery.  Leave your suitcase in the car.  After surgery it may be brought to your room.  For patients admitted to the hospital, discharge time will be determined by your treatment team.  Patients discharged the day of surgery will not be allowed to drive home.    Special instructions:  [facility] - Preparing for Surgery  Before surgery, you can play an important role.  Because skin is not sterile, your skin needs to be as free of germs as possible.  You can reduce the number of germs on you skin by washing with CHG (chlorahexidine gluconate) soap before surgery.  CHG is an antiseptic cleaner which kills germs and bonds with the skin to continue killing germs even after washing.  Please  DO NOT use if you have an allergy to CHG or antibacterial soaps.  If your skin becomes reddened/irritated stop using the CHG and inform your nurse when you arrive at Short Stay.  Do not shave (including legs and underarms) for at least 48 hours prior to the first CHG shower.  You may shave your face.  Please follow these instructions carefully:   1.  Shower with CHG Soap the night before surgery and the                                morning of Surgery.  2.  If you choose to wash your hair, wash your hair first as usual with your       normal shampoo.  3.  After you shampoo, rinse your hair and body thoroughly to remove the                      Shampoo.  4.  Use CHG as you would any other liquid soap.  You can apply chg directly       to the skin and wash gently with scrungie or a clean washcloth.  5.  Apply the CHG Soap to your body ONLY FROM THE NECK DOWN.  Do not use on open wounds or open sores.  Avoid contact with your eyes,       ears, mouth and genitals (private parts).  Wash genitals (private parts)       with your normal soap.  6.  Wash thoroughly, paying special attention to the area where your surgery        will be performed.  7.  Thoroughly rinse your body with warm water from the neck down.  8.  DO NOT shower/wash with your normal soap after using and rinsing off       the CHG Soap.  9.  Pat yourself dry with a clean towel.            10.  Wear clean pajamas.            11.  Place clean sheets on your bed the night of your first shower and do not        sleep with pets.  Day of Surgery  Do not apply any lotions/deoderants the morning of surgery.  Please wear clean clothes to the hospital/surgery center.    Please read over the following fact sheets that you were given. Pain Booklet, Coughing and Deep Breathing, Blood Transfusion Information, MRSA Information and Surgical Site Infection Prevention

## 2014-11-30 NOTE — Progress Notes (Signed)
   11/30/14 1022  OBSTRUCTIVE SLEEP APNEA  Have you ever been diagnosed with sleep apnea through a sleep study? No  Do you snore loudly (loud enough to be heard through closed doors)?  1  Do you often feel tired, fatigued, or sleepy during the daytime? 0  Has anyone observed you stop breathing during your sleep? 1  Do you have, or are you being treated for high blood pressure? 0  BMI more than 35 kg/m2? 0  Age over 42 years old? 0  Neck circumference greater than 40 cm/16 inches? 1 (17.5)  Gender: 1  Obstructive Sleep Apnea Score 4

## 2014-12-01 ENCOUNTER — Encounter (HOSPITAL_COMMUNITY)
Admission: RE | Disposition: A | Discharge status: Home / Self Care | Payer: Self-pay | Source: Ambulatory Visit | Attending: Thoracic Surgery (Cardiothoracic Vascular Surgery)

## 2014-12-01 ENCOUNTER — Inpatient Hospital Stay (HOSPITAL_COMMUNITY)
Admission: RE | Admit: 2014-12-01 | Discharge: 2014-12-07 | DRG: 165 | Disposition: A | Discharge status: Home / Self Care | Payer: Commercial Managed Care - PPO | Source: Ambulatory Visit | Attending: Thoracic Surgery (Cardiothoracic Vascular Surgery) | Admitting: Thoracic Surgery (Cardiothoracic Vascular Surgery)

## 2014-12-01 ENCOUNTER — Inpatient Hospital Stay (HOSPITAL_COMMUNITY): Payer: Commercial Managed Care - PPO | Admitting: Anesthesiology

## 2014-12-01 ENCOUNTER — Inpatient Hospital Stay (HOSPITAL_COMMUNITY): Payer: Commercial Managed Care - PPO

## 2014-12-01 ENCOUNTER — Encounter (HOSPITAL_COMMUNITY): Payer: Self-pay | Admitting: *Deleted

## 2014-12-01 DIAGNOSIS — J189 Pneumonia, unspecified organism: Secondary | ICD-10-CM | POA: Diagnosis present

## 2014-12-01 DIAGNOSIS — K219 Gastro-esophageal reflux disease without esophagitis: Secondary | ICD-10-CM | POA: Diagnosis present

## 2014-12-01 DIAGNOSIS — J45909 Unspecified asthma, uncomplicated: Secondary | ICD-10-CM | POA: Diagnosis present

## 2014-12-01 DIAGNOSIS — J302 Other seasonal allergic rhinitis: Secondary | ICD-10-CM | POA: Diagnosis present

## 2014-12-01 DIAGNOSIS — F419 Anxiety disorder, unspecified: Secondary | ICD-10-CM | POA: Diagnosis present

## 2014-12-01 DIAGNOSIS — R918 Other nonspecific abnormal finding of lung field: Secondary | ICD-10-CM | POA: Diagnosis not present

## 2014-12-01 DIAGNOSIS — C3490 Malignant neoplasm of unspecified part of unspecified bronchus or lung: Secondary | ICD-10-CM

## 2014-12-01 DIAGNOSIS — C3432 Malignant neoplasm of lower lobe, left bronchus or lung: Principal | ICD-10-CM | POA: Diagnosis present

## 2014-12-01 DIAGNOSIS — F329 Major depressive disorder, single episode, unspecified: Secondary | ICD-10-CM | POA: Diagnosis present

## 2014-12-01 DIAGNOSIS — M109 Gout, unspecified: Secondary | ICD-10-CM | POA: Diagnosis present

## 2014-12-01 DIAGNOSIS — E119 Type 2 diabetes mellitus without complications: Secondary | ICD-10-CM | POA: Diagnosis present

## 2014-12-01 DIAGNOSIS — Z8701 Personal history of pneumonia (recurrent): Secondary | ICD-10-CM | POA: Diagnosis not present

## 2014-12-01 DIAGNOSIS — Z79899 Other long term (current) drug therapy: Secondary | ICD-10-CM

## 2014-12-01 DIAGNOSIS — Z791 Long term (current) use of non-steroidal anti-inflammatories (NSAID): Secondary | ICD-10-CM | POA: Diagnosis not present

## 2014-12-01 DIAGNOSIS — Z4682 Encounter for fitting and adjustment of non-vascular catheter: Secondary | ICD-10-CM

## 2014-12-01 DIAGNOSIS — Z9689 Presence of other specified functional implants: Secondary | ICD-10-CM

## 2014-12-01 DIAGNOSIS — J181 Lobar pneumonia, unspecified organism: Secondary | ICD-10-CM

## 2014-12-01 DIAGNOSIS — Z902 Acquired absence of lung [part of]: Secondary | ICD-10-CM

## 2014-12-01 HISTORY — PX: CRYO INTERCOSTAL NERVE BLOCK: SHX6522

## 2014-12-01 HISTORY — PX: VIDEO ASSISTED THORACOSCOPY (VATS)/ LOBECTOMY: SHX6169

## 2014-12-01 LAB — HEMOGLOBIN A1C
Hgb A1c MFr Bld: 6 % — ABNORMAL HIGH (ref 4.8–5.6)
MEAN PLASMA GLUCOSE: 126 mg/dL

## 2014-12-01 LAB — GLUCOSE, CAPILLARY
Glucose-Capillary: 114 mg/dL — ABNORMAL HIGH (ref 65–99)
Glucose-Capillary: 159 mg/dL — ABNORMAL HIGH (ref 65–99)

## 2014-12-01 SURGERY — VIDEO ASSISTED THORACOSCOPY (VATS)/ LOBECTOMY
Anesthesia: General | Site: Chest

## 2014-12-01 MED ORDER — POTASSIUM CHLORIDE 10 MEQ/50ML IV SOLN: 10.0000 meq | Freq: Every day | INTRAVENOUS | Status: DC | PRN

## 2014-12-01 MED ORDER — 0.9 % SODIUM CHLORIDE (POUR BTL) OPTIME
TOPICAL | Status: DC | PRN
  Administered 2014-12-01: 2000 mL

## 2014-12-01 MED ORDER — PNEUMOCOCCAL VAC POLYVALENT 25 MCG/0.5ML IJ INJ
0.5000 mL | INJECTION | INTRAMUSCULAR | Status: AC
  Administered 2014-12-02: 0.5 mL via INTRAMUSCULAR
  Filled 2014-12-01: qty 0.5

## 2014-12-01 MED ORDER — SODIUM CHLORIDE 0.9 % IJ SOLN: 9.0000 mL | INTRAMUSCULAR | Status: DC | PRN

## 2014-12-01 MED ORDER — ACETAMINOPHEN 500 MG PO TABS
1000.0000 mg | ORAL_TABLET | Freq: Four times a day (QID) | ORAL | Status: AC
  Administered 2014-12-01 – 2014-12-06 (×18): 1000 mg via ORAL
  Filled 2014-12-01 (×22): qty 2

## 2014-12-01 MED ORDER — FENTANYL CITRATE (PF) 250 MCG/5ML IJ SOLN
INTRAMUSCULAR | Status: AC
  Filled 2014-12-01: qty 5

## 2014-12-01 MED ORDER — SENNOSIDES-DOCUSATE SODIUM 8.6-50 MG PO TABS
1.0000 | ORAL_TABLET | Freq: Every day | ORAL | Status: DC
  Administered 2014-12-01 – 2014-12-06 (×6): 1 via ORAL
  Filled 2014-12-01 (×7): qty 1

## 2014-12-01 MED ORDER — HEMOSTATIC AGENTS (NO CHARGE) OPTIME
TOPICAL | Status: DC | PRN
  Administered 2014-12-01: 1 via TOPICAL

## 2014-12-01 MED ORDER — ONDANSETRON HCL 4 MG/2ML IJ SOLN: 4.0000 mg | Freq: Four times a day (QID) | INTRAMUSCULAR | Status: DC | PRN

## 2014-12-01 MED ORDER — DULOXETINE HCL 60 MG PO CPEP
60.0000 mg | ORAL_CAPSULE | Freq: Every day | ORAL | Status: DC
  Administered 2014-12-02 – 2014-12-07 (×6): 60 mg via ORAL
  Filled 2014-12-01 (×8): qty 1

## 2014-12-01 MED ORDER — HYDROMORPHONE HCL 1 MG/ML IJ SOLN
INTRAMUSCULAR | Status: AC
  Administered 2014-12-01: 0.5 mg via INTRAVENOUS
  Filled 2014-12-01: qty 1

## 2014-12-01 MED ORDER — FENTANYL CITRATE (PF) 100 MCG/2ML IJ SOLN
INTRAMUSCULAR | Status: DC | PRN
  Administered 2014-12-01 (×2): 100 ug via INTRAVENOUS
  Administered 2014-12-01: 50 ug via INTRAVENOUS
  Administered 2014-12-01: 150 ug via INTRAVENOUS
  Administered 2014-12-01 (×3): 50 ug via INTRAVENOUS
  Administered 2014-12-01: 100 ug via INTRAVENOUS

## 2014-12-01 MED ORDER — DIPHENHYDRAMINE HCL 12.5 MG/5ML PO ELIX
12.5000 mg | ORAL_SOLUTION | Freq: Four times a day (QID) | ORAL | Status: DC | PRN
  Filled 2014-12-01: qty 5

## 2014-12-01 MED ORDER — LACTATED RINGERS IV SOLN
INTRAVENOUS | Status: DC | PRN
  Administered 2014-12-01 (×2): via INTRAVENOUS

## 2014-12-01 MED ORDER — PANTOPRAZOLE SODIUM 40 MG PO TBEC
40.0000 mg | DELAYED_RELEASE_TABLET | Freq: Every day | ORAL | Status: DC
  Administered 2014-12-02 – 2014-12-07 (×6): 40 mg via ORAL
  Filled 2014-12-01 (×7): qty 1

## 2014-12-01 MED ORDER — OXYCODONE HCL 5 MG PO TABS
5.0000 mg | ORAL_TABLET | ORAL | Status: DC | PRN
  Administered 2014-12-01 – 2014-12-02 (×3): 10 mg via ORAL
  Filled 2014-12-01 (×4): qty 2

## 2014-12-01 MED ORDER — BISACODYL 5 MG PO TBEC
10.0000 mg | DELAYED_RELEASE_TABLET | Freq: Every day | ORAL | Status: DC
  Administered 2014-12-02 – 2014-12-06 (×4): 10 mg via ORAL
  Filled 2014-12-01 (×5): qty 2

## 2014-12-01 MED ORDER — ACETAMINOPHEN 160 MG/5ML PO SOLN
1000.0000 mg | Freq: Four times a day (QID) | ORAL | Status: AC
  Filled 2014-12-01 (×20): qty 40

## 2014-12-01 MED ORDER — BUDESONIDE-FORMOTEROL FUMARATE 80-4.5 MCG/ACT IN AERO
2.0000 | INHALATION_SPRAY | Freq: Two times a day (BID) | RESPIRATORY_TRACT | Status: DC
  Administered 2014-12-02 – 2014-12-07 (×9): 2 via RESPIRATORY_TRACT
  Filled 2014-12-01 (×2): qty 6.9

## 2014-12-01 MED ORDER — FENTANYL 10 MCG/ML IV SOLN
INTRAVENOUS | Status: DC
  Administered 2014-12-01: 22:00:00 via INTRAVENOUS
  Administered 2014-12-01: 30 ug via INTRAVENOUS
  Administered 2014-12-01: 314 ug via INTRAVENOUS
  Administered 2014-12-01: 15:00:00 via INTRAVENOUS
  Administered 2014-12-02: 225 ug via INTRAVENOUS
  Administered 2014-12-02: 195 ug via INTRAVENOUS
  Administered 2014-12-02: 06:00:00 via INTRAVENOUS
  Filled 2014-12-01 (×3): qty 50

## 2014-12-01 MED ORDER — PROPOFOL 10 MG/ML IV BOLUS
INTRAVENOUS | Status: DC | PRN
  Administered 2014-12-01: 250 mg via INTRAVENOUS

## 2014-12-01 MED ORDER — PROMETHAZINE HCL 25 MG/ML IJ SOLN: 6.2500 mg | INTRAMUSCULAR | Status: DC | PRN

## 2014-12-01 MED ORDER — TRAMADOL HCL 50 MG PO TABS
50.0000 mg | ORAL_TABLET | Freq: Four times a day (QID) | ORAL | Status: DC | PRN
  Administered 2014-12-01: 50 mg via ORAL
  Administered 2014-12-02: 100 mg via ORAL
  Filled 2014-12-01 (×2): qty 2
  Filled 2014-12-01: qty 1

## 2014-12-01 MED ORDER — ROCURONIUM BROMIDE 100 MG/10ML IV SOLN
INTRAVENOUS | Status: DC | PRN
  Administered 2014-12-01: 70 mg via INTRAVENOUS
  Administered 2014-12-01: 10 mg via INTRAVENOUS
  Administered 2014-12-01: 30 mg via INTRAVENOUS
  Administered 2014-12-01 (×2): 10 mg via INTRAVENOUS

## 2014-12-01 MED ORDER — ONDANSETRON HCL 4 MG/2ML IJ SOLN
INTRAMUSCULAR | Status: DC | PRN
  Administered 2014-12-01: 4 mg via INTRAVENOUS

## 2014-12-01 MED ORDER — DEXTROSE 5 % IV SOLN
1.5000 g | INTRAVENOUS | Status: AC
  Administered 2014-12-01: 1.5 g via INTRAVENOUS

## 2014-12-01 MED ORDER — MIDAZOLAM HCL 2 MG/2ML IJ SOLN
INTRAMUSCULAR | Status: AC
  Filled 2014-12-01: qty 2

## 2014-12-01 MED ORDER — LACTATED RINGERS IV SOLN
INTRAVENOUS | Status: DC | PRN
  Administered 2014-12-01: 08:00:00 via INTRAVENOUS

## 2014-12-01 MED ORDER — DEXTROSE 5 % IV SOLN
1.5000 g | Freq: Two times a day (BID) | INTRAVENOUS | Status: AC
  Administered 2014-12-01 – 2014-12-02 (×2): 1.5 g via INTRAVENOUS
  Filled 2014-12-01 (×2): qty 1.5

## 2014-12-01 MED ORDER — DEXTROSE 5 % IV SOLN
INTRAVENOUS | Status: AC
  Filled 2014-12-01: qty 1.5

## 2014-12-01 MED ORDER — HYDROMORPHONE HCL 1 MG/ML IJ SOLN
0.2500 mg | INTRAMUSCULAR | Status: DC | PRN
  Administered 2014-12-01 (×2): 0.5 mg via INTRAVENOUS

## 2014-12-01 MED ORDER — DEXTROSE-NACL 5-0.9 % IV SOLN
INTRAVENOUS | Status: DC
  Administered 2014-12-01 – 2014-12-04 (×4): via INTRAVENOUS

## 2014-12-01 MED ORDER — PROPOFOL 10 MG/ML IV BOLUS
INTRAVENOUS | Status: AC
  Filled 2014-12-01: qty 20

## 2014-12-01 MED ORDER — GLYCOPYRROLATE 0.2 MG/ML IJ SOLN
INTRAMUSCULAR | Status: DC | PRN
  Administered 2014-12-01: 0.6 mg via INTRAVENOUS

## 2014-12-01 MED ORDER — NALOXONE HCL 0.4 MG/ML IJ SOLN: 0.4000 mg | INTRAMUSCULAR | Status: DC | PRN

## 2014-12-01 MED ORDER — MIDAZOLAM HCL 5 MG/5ML IJ SOLN
INTRAMUSCULAR | Status: DC | PRN
  Administered 2014-12-01: 2 mg via INTRAVENOUS

## 2014-12-01 MED ORDER — DIPHENHYDRAMINE HCL 50 MG/ML IJ SOLN: 12.5000 mg | Freq: Four times a day (QID) | INTRAMUSCULAR | Status: DC | PRN

## 2014-12-01 MED ORDER — NEOSTIGMINE METHYLSULFATE 10 MG/10ML IV SOLN
INTRAVENOUS | Status: DC | PRN
  Administered 2014-12-01: 4 mg via INTRAVENOUS

## 2014-12-01 MED ORDER — PHENYLEPHRINE HCL 10 MG/ML IJ SOLN
INTRAMUSCULAR | Status: DC | PRN
  Administered 2014-12-01 (×2): 200 ug via INTRAVENOUS

## 2014-12-01 SURGICAL SUPPLY — 92 items
ADH SKN CLS APL DERMABOND .7 (GAUZE/BANDAGES/DRESSINGS) ×2
APPLIER CLIP ROT 10 11.4 M/L (STAPLE)
APR CLP MED LRG 11.4X10 (STAPLE)
BAG SPEC RTRVL LRG 6X4 10 (ENDOMECHANICALS)
CANISTER SUCTION 2500CC (MISCELLANEOUS) ×4 IMPLANT
CATH KIT ON Q 5IN SLV (PAIN MANAGEMENT) IMPLANT
CATH THORACIC 28FR (CATHETERS) ×2 IMPLANT
CATH THORACIC 28FR RT ANG (CATHETERS) IMPLANT
CATH THORACIC 36FR (CATHETERS) IMPLANT
CATH THORACIC 36FR RT ANG (CATHETERS) IMPLANT
CLIP APPLIE ROT 10 11.4 M/L (STAPLE) IMPLANT
CLIP TI MEDIUM 6 (CLIP) ×2 IMPLANT
CONN ST 1/4X3/8  BEN (MISCELLANEOUS) ×2
CONN ST 1/4X3/8 BEN (MISCELLANEOUS) IMPLANT
CONN Y 3/8X3/8X3/8  BEN (MISCELLANEOUS) ×4
CONN Y 3/8X3/8X3/8 BEN (MISCELLANEOUS) IMPLANT
CONT SPEC 4OZ CLIKSEAL STRL BL (MISCELLANEOUS) ×24 IMPLANT
COVER SURGICAL LIGHT HANDLE (MISCELLANEOUS) IMPLANT
CUTTER ECHEON FLEX ENDO 45 340 (ENDOMECHANICALS) ×2 IMPLANT
DERMABOND ADVANCED (GAUZE/BANDAGES/DRESSINGS) ×2
DERMABOND ADVANCED .7 DNX12 (GAUZE/BANDAGES/DRESSINGS) IMPLANT
DRAIN CHANNEL 28F RND 3/8 FF (WOUND CARE) ×2 IMPLANT
DRAPE LAPAROSCOPIC ABDOMINAL (DRAPES) ×4 IMPLANT
DRAPE SLUSH/WARMER DISC (DRAPES) ×2 IMPLANT
DRAPE WARM FLUID 44X44 (DRAPE) ×2 IMPLANT
ELECT BLADE 6.5 EXT (BLADE) ×4 IMPLANT
ELECT REM PT RETURN 9FT ADLT (ELECTROSURGICAL) ×4
ELECTRODE REM PT RTRN 9FT ADLT (ELECTROSURGICAL) ×2 IMPLANT
GAUZE SPONGE 4X4 12PLY STRL (GAUZE/BANDAGES/DRESSINGS) ×8 IMPLANT
GLOVE BIO SURGEON STRL SZ7.5 (GLOVE) ×2 IMPLANT
GLOVE BIOGEL PI IND STRL 6 (GLOVE) IMPLANT
GLOVE BIOGEL PI IND STRL 6.5 (GLOVE) IMPLANT
GLOVE BIOGEL PI INDICATOR 6 (GLOVE) ×4
GLOVE BIOGEL PI INDICATOR 6.5 (GLOVE) ×6
GLOVE SURG SIGNA 7.5 PF LTX (GLOVE) ×8 IMPLANT
GOWN STRL REUS W/ TWL LRG LVL3 (GOWN DISPOSABLE) ×4 IMPLANT
GOWN STRL REUS W/ TWL XL LVL3 (GOWN DISPOSABLE) ×2 IMPLANT
GOWN STRL REUS W/TWL LRG LVL3 (GOWN DISPOSABLE) ×12
GOWN STRL REUS W/TWL XL LVL3 (GOWN DISPOSABLE) ×4
HEMOSTAT SURGICEL 2X14 (HEMOSTASIS) ×2 IMPLANT
KIT BASIN OR (CUSTOM PROCEDURE TRAY) ×4 IMPLANT
KIT ROOM TURNOVER OR (KITS) ×4 IMPLANT
KIT SUCTION CATH 14FR (SUCTIONS) ×2 IMPLANT
NS IRRIG 1000ML POUR BTL (IV SOLUTION) ×8 IMPLANT
PACK CHEST (CUSTOM PROCEDURE TRAY) ×4 IMPLANT
PAD ARMBOARD 7.5X6 YLW CONV (MISCELLANEOUS) ×8 IMPLANT
POUCH ENDO CATCH II 15MM (MISCELLANEOUS) IMPLANT
POUCH SPECIMEN RETRIEVAL 10MM (ENDOMECHANICALS) IMPLANT
PROBE CRYO2-ABLATION MALLABLE (MISCELLANEOUS) ×2 IMPLANT
RELOAD GREEN ECHELON 45 (STAPLE) ×2 IMPLANT
RELOAD STAPLE 35X2.5 WHT THIN (STAPLE) IMPLANT
SCISSORS ENDO CVD 5DCS (MISCELLANEOUS) IMPLANT
SEALANT PROGEL (MISCELLANEOUS) IMPLANT
SEALANT SURG COSEAL 4ML (VASCULAR PRODUCTS) IMPLANT
SEALANT SURG COSEAL 8ML (VASCULAR PRODUCTS) IMPLANT
SOLUTION ANTI FOG 6CC (MISCELLANEOUS) ×4 IMPLANT
SPECIMEN JAR MEDIUM (MISCELLANEOUS) IMPLANT
SPONGE GAUZE 4X4 12PLY STER LF (GAUZE/BANDAGES/DRESSINGS) ×2 IMPLANT
SPONGE INTESTINAL PEANUT (DISPOSABLE) ×2 IMPLANT
STAPLE RELOAD 2.5MM WHITE (STAPLE) ×12 IMPLANT
STAPLER VASCULAR ECHELON 35 (CUTTER) ×2 IMPLANT
SUT PROLENE 4 0 RB 1 (SUTURE)
SUT PROLENE 4-0 RB1 .5 CRCL 36 (SUTURE) IMPLANT
SUT SILK  1 MH (SUTURE) ×4
SUT SILK 1 MH (SUTURE) ×4 IMPLANT
SUT SILK 1 TIES 10X30 (SUTURE) ×4 IMPLANT
SUT SILK 2 0 SH (SUTURE) IMPLANT
SUT SILK 2 0SH CR/8 30 (SUTURE) IMPLANT
SUT SILK 3 0 SH 30 (SUTURE) IMPLANT
SUT SILK 3 0SH CR/8 30 (SUTURE) IMPLANT
SUT VIC AB 1 CTX 36 (SUTURE) ×4
SUT VIC AB 1 CTX36XBRD ANBCTR (SUTURE) IMPLANT
SUT VIC AB 2-0 CTX 36 (SUTURE) ×2 IMPLANT
SUT VIC AB 2-0 UR6 27 (SUTURE) IMPLANT
SUT VIC AB 3-0 MH 27 (SUTURE) IMPLANT
SUT VIC AB 3-0 X1 27 (SUTURE) ×4 IMPLANT
SUT VICRYL 2 TP 1 (SUTURE) IMPLANT
SWAB COLLECTION DEVICE MRSA (MISCELLANEOUS) IMPLANT
SYSTEM SAHARA CHEST DRAIN ATS (WOUND CARE) ×4 IMPLANT
TAPE CLOTH 4X10 WHT NS (GAUZE/BANDAGES/DRESSINGS) ×4 IMPLANT
TAPE CLOTH SURG 4X10 WHT LF (GAUZE/BANDAGES/DRESSINGS) ×2 IMPLANT
TIP APPLICATOR SPRAY EXTEND 16 (VASCULAR PRODUCTS) IMPLANT
TOWEL OR 17X24 6PK STRL BLUE (TOWEL DISPOSABLE) ×4 IMPLANT
TOWEL OR 17X26 10 PK STRL BLUE (TOWEL DISPOSABLE) ×4 IMPLANT
TRAP SPECIMEN MUCOUS 40CC (MISCELLANEOUS) IMPLANT
TRAY FOLEY CATH 16FRSI W/METER (SET/KITS/TRAYS/PACK) ×4 IMPLANT
TROCAR XCEL BLADELESS 5X75MML (TROCAR) ×4 IMPLANT
TUBE ANAEROBIC SPECIMEN COL (MISCELLANEOUS) IMPLANT
TUBE CONNECTING 12'X1/4 (SUCTIONS) ×1
TUBE CONNECTING 12X1/4 (SUCTIONS) ×1 IMPLANT
TUNNELER SHEATH ON-Q 11GX8 DSP (PAIN MANAGEMENT) IMPLANT
WATER STERILE IRR 1000ML POUR (IV SOLUTION) ×8 IMPLANT

## 2014-12-01 NOTE — Anesthesia Procedure Notes (Signed)
Procedure Name: Intubation Date/Time: 12/01/2014 8:49 AM Performed by: Shirlyn Goltz Pre-anesthesia Checklist: Patient identified, Emergency Drugs available, Suction available and Patient being monitored Patient Re-evaluated:Patient Re-evaluated prior to inductionOxygen Delivery Method: Circle system utilized Preoxygenation: Pre-oxygenation with 100% oxygen Intubation Type: IV induction Ventilation: Mask ventilation without difficulty and Oral airway inserted - appropriate to patient size Laryngoscope Size: Mac and 3 Grade View: Grade I Tube type: Oral Tube size: 6.5 mm Number of attempts: 2 Airway Equipment and Method: Stylet Placement Confirmation: ETT inserted through vocal cords under direct vision,  positive ETCO2 and breath sounds checked- equal and bilateral Comments: Dl x1 by CRNA with mac 3 - grade 2 view but unable to pass double lumen tube.   DL x1 by MD with miller 3- grade 2 view but unable to pass double lumen tube; 6.5 oral tube placed at this time  **difficulty passing DLT only, no difficulty with regular oral intubation

## 2014-12-01 NOTE — Interval H&P Note (Signed)
History and Physical Interval Note:  12/01/2014 8:08 AM  Andrew Roth  has presented today for surgery, with the diagnosis of LLL CONSOLIDATION  The various methods of treatment have been discussed with the patient and family. After consideration of risks, benefits and other options for treatment, the patient has consented to  Procedure(s): VIDEO ASSISTED THORACOSCOPY (VATS)/ LOBECTOMY (Left) CRYO INTERCOSTAL NERVE BLOCK (N/A) as a surgical intervention .  The patient's history has been reviewed, patient examined, no change in status, stable for surgery.  I have reviewed the patient's chart and labs.  Questions were answered to the patient's satisfaction.     [name]

## 2014-12-01 NOTE — H&P (Signed)
[address]  [place] [address]  [phone]    HPI:  Andrew Roth returns to discuss the results of his bronchoscopy.  He is a 44 year old lifelong nonsmoker who has been having respiratory problems for the past 4 years. He says that 4 years ago he was diagnosed with pneumonia. He has a chronic cough and shortness of breath. The cough is productive and usually is clear. In January he had an episode with a worsening cough and fevers. A chest x-ray was done and showed left lower lobe infiltrate. He was treated with antibiotics. His symptoms improved with antibiotics, but after treatment a chest x-ray showed a persistent infiltrate. ACT scan done on 06/16/2014 showed a cavitating mass with air bronchograms in the left lower lobe. This was felt to be consistent with pneumonia with a possible developing lung abscess.  He was referred to Dr. Melvyn Novas, who did a bronchoscopy. It showed no endobronchial mass lesions. Cytology was negative for malignant cells. AFB and fungal stains were negative. Bacterial culture showed only normal flora.  He continues to have a productive cough and shortness of breath with exertion. A repeat chest x-ray in March showed a persistent infiltrate in the left lower lobe. His weight has been stable. He works in his own pressure washing business. His wife is a Marine scientist. He has 5 children. He is a lifelong nonsmoker.  A PET/CT was done which showed progression of the consolidative process of the left lower lobe. There was a cavitary area with moderate hypermetabolic activity. No definite tumor was seen.  I did a repeat bronchoscopy last week. There were no endobronchial mass lesions. Multiple biopsies, brushings, and aspirations were performed both for pathology and microbiology.  Past Medical History  Diagnosis Date  . Depression   . Seasonal allergies   . Arthritis   . Asthma   . Gout   .  Pneumonia   . Anxiety     Current Outpatient Prescriptions  Medication Sig Dispense Refill  . acetaminophen (TYLENOL) 325 MG tablet Take 650 mg by mouth every 6 (six) hours as needed for moderate pain.     . budesonide-formoterol (SYMBICORT) 80-4.5 MCG/ACT inhaler Take 2 puffs first thing in am and then another 2 puffs about 12 hours later. (Patient taking differently: Inhale 2 puffs into the lungs every 12 (twelve) hours. Take 2 puffs first thing in am and then another 2 puffs about 12 hours later.)    . cetirizine (ZYRTEC) 10 MG tablet Take 20 mg by mouth daily.    . DULoxetine (CYMBALTA) 60 MG capsule Take 60 mg by mouth daily.      No current facility-administered medications for this visit.    Physical Exam BP 150/98 mmHg  Pulse 100  Resp 20  Ht '5\' 10"'$  (1.778 m)  Wt 214 lb (97.07 kg)  BMI 30.71 kg/m2  SpO2 98% Well-appearing 44 year old man in no acute distress Alert and oriented 3 with no focal deficits Cardiac regular rate and rhythm Lungs with rhonchi on left  Diagnostic Tests: Cultures to date no growth, no AFB or fungus on stains Pathology showed chronic inflammation. Single focus of highly atypical cells.  Impression: Andrew Roth is a 44 year old man with a 4 year history of chronic respiratory issues. He has a cavitary area in his left lower lobe with consolidation of the majority of the lobe seen on chest x-ray and CT. There is no endobronchial obstruction to relieve that would improve the situation. I suspect that  had destruction of the central area of the lobe with his original infection and that his left him susceptible to repeat infections. Given the progression over time there is reason to believe that he will continue to be at risk for repeated pneumonias.  My recommendation was to proceed with a left VATS and lower lobectomy to remove the diseased portion of the lung. We had a long discussion regarding this issue. We certainly  do not take the recommendation for a lobectomy lightly, I just do not see any other good option in his case. I don't think he will have a major impact on his respiratory status compared to his current status given the majority of the lobe is involved with this process. I suspect that the highly atypical cells seen on pathology are more likely a reflection of the severity of the inflammatory process rather than evidence of malignancy, but malignancy cannot be definitively ruled out.  I described the proposed procedure to him in detail. We discussed the general nature of the operation, the incisions to be used, the need for general anesthesia, the use of chest tubes postoperatively, expected hospital stay, and overall recovery. I reviewed the indications, risks, benefits, and alternatives. He understands the risks include, but are not limited to death, MI, DVT, PE, bleeding, possible need for transfusion, infection, prolonged air leak, chronic pain, cardiac arrhythmias, as well as the possibility of unforeseeable, complications.  We did discuss the use of cryo-analgesia of the intercostal nerves to help with postoperative pain control. We discussed the risks and benefits of that as well.  At this point he is anxious to proceed with surgery. He previously had been reluctant, but discussions with his wife is a nurse have convinced him to proceed. He has a trip coming up to [facility] in about 3 weeks and wants to wait until after that to have surgery done. I will be gone the following week, so we have tentatively scheduled his surgery for July 6.  Plan: Left VATS, left lower lobectomy, crow analgesia of intercostal nerves on 12/01/2014  [name], MD [facility] [phone]     No interval change. CXR yesterday unchanged from May  Jaquanna Ballentine C. Roxan Hockey, MD [facility] [phone]

## 2014-12-01 NOTE — Anesthesia Postprocedure Evaluation (Signed)
  Anesthesia Post-op Note  Patient: Andrew Roth  Procedure(s) Performed: Procedure(s): VIDEO ASSISTED THORACOSCOPY (VATS)/ LOBECTOMY (Left) CRYO INTERCOSTAL NERVE BLOCK (N/A)  Patient Location: PACU  Anesthesia Type:General  Level of Consciousness: awake, sedated and patient cooperative  Airway and Oxygen Therapy: Patient Spontanous Breathing  Post-op Pain: mild  Post-op Assessment: Post-op Vital signs reviewed, Patient's Cardiovascular Status Stable and Respiratory Function Stable              Post-op Vital Signs: stable  Last Vitals:  Filed Vitals:   12/01/14 1333  BP:   Pulse: 97  Temp:   Resp: 20    Complications: No apparent anesthesia complications

## 2014-12-01 NOTE — Transfer of Care (Signed)
Immediate Anesthesia Transfer of Care Note  Patient: Andrew Roth  Procedure(s) Performed: Procedure(s): VIDEO ASSISTED THORACOSCOPY (VATS)/ LOBECTOMY (Left) CRYO INTERCOSTAL NERVE BLOCK (N/A)  Patient Location: PACU  Anesthesia Type:General  Level of Consciousness: awake  Airway & Oxygen Therapy: Patient Spontanous Breathing and Patient connected to face mask oxygen  Post-op Assessment: Report given to RN, Post -op Vital signs reviewed and stable, Patient moving all extremities and Patient moving all extremities X 4  Post vital signs: Reviewed and stable  Last Vitals:  Filed Vitals:   12/01/14 1250  BP:   Pulse:   Temp: 36.2 C  Resp:     Complications: No apparent anesthesia complications

## 2014-12-01 NOTE — Anesthesia Preprocedure Evaluation (Signed)
Anesthesia Evaluation  Patient identified by MRN, date of birth, ID band Patient awake    Reviewed: Allergy & Precautions, NPO status , Patient's Chart, lab work & pertinent test results  History of Anesthesia Complications Negative for: history of anesthetic complications  Airway Mallampati: II  TM Distance: >3 FB Neck ROM: Full    Dental  (+) Teeth Intact   Pulmonary asthma , neg sleep apnea, pneumonia -, resolved, neg recent URI,  breath sounds clear to auscultation        Cardiovascular negative cardio ROS  Rhythm:Regular Rate:Normal     Neuro/Psych PSYCHIATRIC DISORDERS negative neurological ROS     GI/Hepatic negative GI ROS, Neg liver ROS, GERD-  ,  Endo/Other  diabetes  Renal/GU      Musculoskeletal   Abdominal   Peds  Hematology negative hematology ROS (+)   Anesthesia Other Findings   Reproductive/Obstetrics                             Anesthesia Physical Anesthesia Plan  ASA: III  Anesthesia Plan: General   Post-op Pain Management:    Induction: Intravenous  Airway Management Planned: Double Lumen EBT  Additional Equipment: CVP and Arterial line  Intra-op Plan:   Post-operative Plan: Extubation in OR  Informed Consent: I have reviewed the patients History and Physical, chart, labs and discussed the procedure including the risks, benefits and alternatives for the proposed anesthesia with the patient or authorized representative who has indicated his/her understanding and acceptance.     Plan Discussed with: CRNA and Surgeon  Anesthesia Plan Comments:         Anesthesia Quick Evaluation

## 2014-12-01 NOTE — Brief Op Note (Addendum)
12/01/2014      [address]       [place] [address]             [phone]     12/01/2014  12:23 PM  PATIENT:  Andrew Roth  44 y.o. male  PRE-OPERATIVE DIAGNOSIS:  LLL CONSOLIDATION  POST-OPERATIVE DIAGNOSIS:  ADENOCARCINOMA LEFT LOWER LOBE  PROCEDURE:  Procedure(s): LEFT VIDEO ASSISTED THORACOSCOPY (VATS) THORACOSCOPIC LEFT LOWER LOBECTOMY MEDIASTINAL LYMPH NODE DISSECTION CRYO INTERCOSTAL NERVE BLOCK  SURGEON:  Surgeon(s): [name], MD  PHYSICIAN ASSISTANT: WAYNE GOLD PA-C  ANESTHESIA:   general  SPECIMEN:  Source of Specimen:  LLL AND LN'S  DISPOSITION OF SPECIMEN:  Pathology  DRAINS: 2 Chest Tube(s) in the LEFT HEMITHORAX   PATIENT CONDITION:  PACU - hemodynamically stable.  PRE-OPERATIVE WEIGHT: 96kg  FROZEN: ADENOCARCINOMA, bronchial margin clear  COMPLICATIONS: NO KNOWN  EBL: 350 ML

## 2014-12-02 ENCOUNTER — Encounter (HOSPITAL_COMMUNITY): Payer: Self-pay | Admitting: Thoracic Surgery (Cardiothoracic Vascular Surgery)

## 2014-12-02 ENCOUNTER — Inpatient Hospital Stay (HOSPITAL_COMMUNITY): Payer: Commercial Managed Care - PPO

## 2014-12-02 LAB — URINALYSIS, ROUTINE W REFLEX MICROSCOPIC
Bilirubin Urine: NEGATIVE
Glucose, UA: NEGATIVE mg/dL
Ketones, ur: NEGATIVE mg/dL
LEUKOCYTES UA: NEGATIVE
Nitrite: NEGATIVE
PROTEIN: NEGATIVE mg/dL
Specific Gravity, Urine: 1.025 (ref 1.005–1.030)
UROBILINOGEN UA: 0.2 mg/dL (ref 0.0–1.0)
pH: 5 (ref 5.0–8.0)

## 2014-12-02 LAB — BASIC METABOLIC PANEL
Anion gap: 5 (ref 5–15)
BUN: 8 mg/dL (ref 6–20)
CALCIUM: 7.9 mg/dL — AB (ref 8.9–10.3)
CO2: 26 mmol/L (ref 22–32)
CREATININE: 0.8 mg/dL (ref 0.61–1.24)
Chloride: 104 mmol/L (ref 101–111)
GFR calc Af Amer: >60 mL/min (ref >60)
GFR calc non Af Amer: >60 mL/min (ref >60)
GLUCOSE: 132 mg/dL — AB (ref 65–99)
Potassium: 3.8 mmol/L (ref 3.5–5.1)
SODIUM: 135 mmol/L (ref 135–145)

## 2014-12-02 LAB — CBC
HCT: 38.6 % — ABNORMAL LOW (ref 39.0–52.0)
Hemoglobin: 13.7 g/dL (ref 13.0–17.0)
MCH: 31.2 pg (ref 26.0–34.0)
MCHC: 35.5 g/dL (ref 30.0–36.0)
MCV: 87.9 fL (ref 78.0–100.0)
Platelets: 236 10*3/uL (ref 150–400)
RBC: 4.39 MIL/uL (ref 4.22–5.81)
RDW: 13 % (ref 11.5–15.5)
WBC: 10.1 10*3/uL (ref 4.0–10.5)

## 2014-12-02 LAB — BLOOD GAS, ARTERIAL
Acid-Base Excess: 0.9 mmol/L (ref 0.0–2.0)
Bicarbonate: 25.5 mEq/L — ABNORMAL HIGH (ref 20.0–24.0)
FIO2: 0.32 %
O2 Saturation: 95.1 %
PATIENT TEMPERATURE: 98.6
PH ART: 7.377 (ref 7.350–7.450)
TCO2: 26.9 mmol/L (ref 0–100)
pCO2 arterial: 44.5 mmHg (ref 35.0–45.0)
pO2, Arterial: 71.8 mmHg — ABNORMAL LOW (ref 80.0–100.0)

## 2014-12-02 LAB — URINE MICROSCOPIC-ADD ON

## 2014-12-02 MED ORDER — HYDROMORPHONE 0.3 MG/ML IV SOLN
INTRAVENOUS | Status: DC
  Administered 2014-12-02: 1.5 mg via INTRAVENOUS
  Administered 2014-12-02: 0.3 mg via INTRAVENOUS
  Administered 2014-12-02: 08:00:00 via INTRAVENOUS
  Administered 2014-12-02: 2.6 mg via INTRAVENOUS
  Administered 2014-12-03: 2.1 mg via INTRAVENOUS
  Administered 2014-12-03: 1.2 mg via INTRAVENOUS
  Administered 2014-12-03: 22:00:00 via INTRAVENOUS
  Administered 2014-12-03: 0.9 mg via INTRAVENOUS
  Administered 2014-12-03: 2.35 mg via INTRAVENOUS
  Administered 2014-12-03: 2.1 mg via INTRAVENOUS
  Administered 2014-12-03 – 2014-12-04 (×2): 0.9 mg via INTRAVENOUS
  Administered 2014-12-04: 1.2 mg via INTRAVENOUS
  Administered 2014-12-04: 16:00:00 via INTRAVENOUS
  Administered 2014-12-04: 2.7 mg via INTRAVENOUS
  Administered 2014-12-04: 0.9 mg via INTRAVENOUS
  Administered 2014-12-05: 1.2 mg via INTRAVENOUS
  Administered 2014-12-05: 0.3 mg via INTRAVENOUS
  Filled 2014-12-02 (×3): qty 25

## 2014-12-02 MED ORDER — SODIUM CHLORIDE 0.9 % IJ SOLN: 9.0000 mL | INTRAMUSCULAR | Status: DC | PRN

## 2014-12-02 MED ORDER — ENOXAPARIN SODIUM 40 MG/0.4ML ~~LOC~~ SOLN
40.0000 mg | SUBCUTANEOUS | Status: DC
  Administered 2014-12-02 – 2014-12-06 (×5): 40 mg via SUBCUTANEOUS
  Filled 2014-12-02 (×7): qty 0.4

## 2014-12-02 MED ORDER — NALOXONE HCL 0.4 MG/ML IJ SOLN: 0.4000 mg | INTRAMUSCULAR | Status: DC | PRN

## 2014-12-02 MED ORDER — OXYCODONE HCL 5 MG PO TABS
5.0000 mg | ORAL_TABLET | ORAL | Status: DC | PRN
  Administered 2014-12-02 – 2014-12-04 (×7): 10 mg via ORAL
  Administered 2014-12-05: 5 mg via ORAL
  Administered 2014-12-06 – 2014-12-07 (×4): 10 mg via ORAL
  Filled 2014-12-02 (×7): qty 2
  Filled 2014-12-02: qty 1
  Filled 2014-12-02 (×4): qty 2

## 2014-12-02 MED ORDER — DIPHENHYDRAMINE HCL 50 MG/ML IJ SOLN: 12.5000 mg | Freq: Four times a day (QID) | INTRAMUSCULAR | Status: DC | PRN

## 2014-12-02 MED ORDER — HYDROMORPHONE 0.3 MG/ML IV SOLN
INTRAVENOUS | Status: AC
  Filled 2014-12-02: qty 25

## 2014-12-02 MED ORDER — DIPHENHYDRAMINE HCL 12.5 MG/5ML PO ELIX
12.5000 mg | ORAL_SOLUTION | Freq: Four times a day (QID) | ORAL | Status: DC | PRN
  Filled 2014-12-02: qty 5

## 2014-12-02 MED ORDER — ONDANSETRON HCL 4 MG/2ML IJ SOLN
4.0000 mg | Freq: Four times a day (QID) | INTRAMUSCULAR | Status: DC | PRN
  Administered 2014-12-02 (×2): 4 mg via INTRAVENOUS
  Filled 2014-12-02 (×2): qty 2

## 2014-12-02 NOTE — Op Note (Signed)
NAMELUKAS, PELCHER NO.:  [id number]  MEDICAL RECORD NO.:  [id number]  LOCATION:  9F02O                        FACILITY:  [facility]  PHYSICIAN:  Revonda Standard. Roxan Hockey, M.D.DATE OF BIRTH:  [birth year]  DATE OF PROCEDURE:  12/01/2014 DATE OF DISCHARGE:                              OPERATIVE REPORT   PREOPERATIVE DIAGNOSIS:  Left lower lobe consolidation/mass.  POSTOPERATIVE DIAGNOSIS:  Adenocarcinoma, left lower lobe.  PROCEDURE:   Left video-assisted thoracoscopy Thoracoscopic left lower lobectomy Mediastinal lymph node dissection Cryoanalgesia of intercostal nerves 3 through 7.  SURGEON:  Revonda Standard. Roxan Hockey, M.D.  ASSISTANT:  John Giovanni, PA.-C.  ANESTHESIA:  General.  FINDINGS:  Large mass occupying superior aspect of left lower lobe. Multiple enlarged lymph nodes, frozen section of level 9 node showed no tumor.  The mass disrupted on removal from the chest within the endoscopic retrieval bag.  No spillage in the chest.  Frozen section of mass revealed adenocarcinoma.  The bronchial margins were negative.  CLINICAL NOTE:  Mr. Fauth is a 44 year old man, nonsmoker, who has been having respiratory difficulties for several years that worsened recently.  Earlier this year, chest x-ray showed a left lower lobe opacity.  A CT was done and showed a suspected abscess.  He was treated with antibiotics, but the CT never cleared completely.  He had bronchoscopy done in March which was nondiagnostic.  He was referred for surgical consultation in April and was advised to undergo surgical resection, but was reluctant to do so as he was feeling better at that time.  He did agree to have a repeat scan done. This was done with a PET- CT and it showed the mass was unchanged in size. There was a small area of hypermetabolic activity, but the majority of the mass was not hypermetabolic. He underwent a repeat bronchoscopy in May.  Atypical cells were seen, but no tumor  was noted. He was advised again to undergo surgical resection.  He finally agreed to proceed, but wanted to wait until after his upcoming vacation.  He now presents for surgery for left lower lobectomy.  The indications, risks, benefits, and alternatives were discussed in detail with the patient. He understood and accepted the risks and agreed to proceed.  OPERATIVE NOTE:  Mr. Mccubbins was brought to the preoperative holding area on December 01, 2014.  Anesthesia placed a central line and arterial blood pressure monitoring line.  He was taken to the operating room, anesthetized, and intubated with a double-lumen endotracheal tube.  A Foley catheter was placed.  Sequential compressive devices were placed on the calves for DVT prophylaxis.  Intravenous antibiotics were administered.  He was placed in a right lateral decubitus position and the left chest was prepped and draped in usual sterile fashion.  Single lung ventilation of the right lung was initiated and was tolerated well throughout the procedure.  An incision made in the seventh intercostal space in the anterior axillary line.  A 5 mm port was inserted and the thoracoscope was advanced in the chest.  There was no effusion and no abnormality of the visceral or parietal pleura.  A working incision was made in the fourth intercostal  space anterolaterally.  No rib spreading was performed during the dissection, but the ribs did have to be spread to allow the mass to be removed from the chest.  Inspection of the lower lobe revealed a marked demarcation between the involved area in the superior segment and a portion of the basilar segment and the relatively normal areas in the basilar segment.  Attempts to resect just the abnormal area would have left a very small rim of tissue and the decision was made to proceed with lobectomy as planned. The inferior ligament was divided with electrocautery. While dividing the ligament, an enlarged lymph node  was noted.  It was grayish in color and appeared abnormal. This node was sent for frozen section which subsequently returned with no tumor seen.  Multiple other enlarged nodes with a similar appearance were removed and sent as separate specimens during the dissection of the lower lobe.  These were all sent for permanent pathology.  The pleural reflection was divided anteriorly and posteriorly with electrocautery. The fissure was inspected and was nearly complete.  The pleura overlying the pulmonary artery was incised with cautery.  There were large nodes overlying the basilar segmental pulmonary artery branches, which were removed. After doing so, the basilar segmental artery trunk was encircled and then divided with an endoscopic vascular stapler.  The superior segmental arterial branch was divided separately.  The inferior pulmonary vein was encircled and divided with an endoscopic vascular stapler. There were multiple enlarged nodes around the left lower lobe bronchus, these were taken with the specimen.  After dissecting out the bronchus, a stapler was placed across the bronchus at its origin and closed.  A test inflation showed good aeration of the upper lobe. The stapler was fired transecting the lower lobe bronchus.  The lower lobe was placed into an endoscopic retrieval bag.  An attempt was made to pull the lobe through the chest incision, but the mass was too large.  A laminar spreader was used to spread the ribs. As the bag was being pulled out, the bag tore.  The specimen then was placed into a separate second endoscopic retrieval bag with one bag inside the other. The specimen then was removed from the chest.  There was disruption of the visceral pleural surface during removal from the chest, but this was contained within the endoscopic retrieval bag.  There was no spillage into the incision or the chest.  The chest was copiously irrigated with warm saline.  A test inflation to  30 cm of water revealed no air leakage. The frozen section returned showing adenocarcinoma.  The bronchial margin was clear.  The subcarinal space was inspected, but was difficult to access and there was no clear nodal tissue that could be identified.  The aortopulmonary window was opened and level 5 nodes were removed and sent for permanent pathology.  Final inspection was made for hemostasis.  A 28-French Blake drain was placed through the original port incision and directed posteriorly.  A 28-French chest tube was placed through a separate stab incision and directed anteriorly.  These were secured to skin with #1 silk sutures.  The left upper lobe was reinflated. The serratus was reapproximated with running #1 Vicryl suture.  The subcutaneous tissue and skin were closed in standard fashion.  The chest tubes were placed to suction.  The patient was placed back in a supine position.  He was extubated in the operating room and taken to the postanesthetic care unit in good condition.  Revonda Standard Roxan Hockey, M.D.     SCH/MEDQ  D:  12/01/2014  T:  12/02/2014  Job:  [id number]

## 2014-12-02 NOTE — Progress Notes (Signed)
UR COMPLETED  

## 2014-12-02 NOTE — Progress Notes (Signed)
1 Day Post-Op Procedure(s) (LRB): VIDEO ASSISTED THORACOSCOPY (VATS)/ LOBECTOMY (Left) CRYO INTERCOSTAL NERVE BLOCK (N/A) Subjective: C/o incisional pain Fentanyl PCA ineffective  Objective: Vital signs in last 24 hours: Temp:  [97.2 F (36.2 C)-99 F (37.2 C)] 98.6 F (37 C) (07/07 0320) Pulse Rate:  [78-112] 102 (07/07 0320) Cardiac Rhythm:  [-] Sinus tachycardia (07/07 0320) Resp:  [10-43] 32 (07/07 0320) BP: (122-151)/(77-91) 132/85 mmHg (07/07 0320) SpO2:  [91 %-98 %] 96 % (07/07 0735) Arterial Line BP: (79-159)/(62-82) 120/71 mmHg (07/07 0320) Weight:  [213 lb 3 oz (96.7 kg)] 213 lb 3 oz (96.7 kg) (07/06 1521)  Hemodynamic parameters for last 24 hours:    Intake/Output from previous day: 07/06 0701 - 07/07 0700 In: 4155 [P.O.:480; I.V.:3625; IV Piggyback:50] Out: 2184 [Urine:1250; Blood:500; Chest Tube:434] Intake/Output this shift:    General appearance: alert and mild distress Neurologic: intact Heart: regular rate and rhythm Lungs: diminished breath sounds left base Abdomen: mildly distended, hypoactive BS, nontender no air leak, serous drainage  Lab Results:  Recent Labs  11/30/14 1043 12/02/14 0320  WBC 7.3 10.1  HGB 15.9 13.7  HCT 44.7 38.6*  PLT 241 236   BMET:  Recent Labs  11/30/14 1043 12/02/14 0320  NA 137 135  K 4.1 3.8  CL 103 104  CO2 26 26  GLUCOSE 96 132*  BUN 9 8  CREATININE 0.98 0.80  CALCIUM 9.3 7.9*    PT/INR:  Recent Labs  11/30/14 1043  LABPROT 13.1  INR 0.97   ABG    Component Value Date/Time   PHART 7.377 12/02/2014 0320   HCO3 25.5* 12/02/2014 0320   TCO2 26.9 12/02/2014 0320   ACIDBASEDEF 1.0 11/30/2014 1100   O2SAT 95.1 12/02/2014 0320   CBG (last 3)   Recent Labs  11/30/14 1032 12/01/14 0659 12/01/14 1424  GLUCAP 95 114* 159*    Assessment/Plan: S/P Procedure(s) (LRB): VIDEO ASSISTED THORACOSCOPY (VATS)/ LOBECTOMY (Left) CRYO INTERCOSTAL NERVE BLOCK (N/A) POD # 1  CV- stable RESP- s/p left  lower lobectomy  No air leak- CT to water seal RENAL- lytes and creatinine OK ENDO- CBG mildly elevated, follow PAIN- primary issue at present- will change oxycodone to Q3, change PCA to dilaudid DVT prophylaxis- SCD in place, add enoxaparin Mobilize    LOS: 1 day    [name] 12/02/2014

## 2014-12-03 ENCOUNTER — Inpatient Hospital Stay (HOSPITAL_COMMUNITY): Payer: Commercial Managed Care - PPO

## 2014-12-03 LAB — COMPREHENSIVE METABOLIC PANEL
ALBUMIN: 2.9 g/dL — AB (ref 3.5–5.0)
ALK PHOS: 42 U/L (ref 38–126)
ALT: 19 U/L (ref 17–63)
AST: 17 U/L (ref 15–41)
Anion gap: 6 (ref 5–15)
BUN: 8 mg/dL (ref 6–20)
CALCIUM: 7.1 mg/dL — AB (ref 8.9–10.3)
CO2: 27 mmol/L (ref 22–32)
Chloride: 103 mmol/L (ref 101–111)
Creatinine, Ser: 1 mg/dL (ref 0.61–1.24)
GFR calc non Af Amer: >60 mL/min (ref >60)
Glucose, Bld: 114 mg/dL — ABNORMAL HIGH (ref 65–99)
Potassium: 3.8 mmol/L (ref 3.5–5.1)
SODIUM: 136 mmol/L (ref 135–145)
TOTAL PROTEIN: 5.2 g/dL — AB (ref 6.5–8.1)
Total Bilirubin: 0.8 mg/dL (ref 0.3–1.2)

## 2014-12-03 LAB — CBC
HEMATOCRIT: 36.8 % — AB (ref 39.0–52.0)
HEMOGLOBIN: 12.5 g/dL — AB (ref 13.0–17.0)
MCH: 30 pg (ref 26.0–34.0)
MCHC: 34 g/dL (ref 30.0–36.0)
MCV: 88.2 fL (ref 78.0–100.0)
Platelets: 225 10*3/uL (ref 150–400)
RBC: 4.17 MIL/uL — AB (ref 4.22–5.81)
RDW: 13.1 % (ref 11.5–15.5)
WBC: 8.8 10*3/uL (ref 4.0–10.5)

## 2014-12-03 MED ORDER — METOCLOPRAMIDE HCL 5 MG/ML IJ SOLN
10.0000 mg | Freq: Four times a day (QID) | INTRAMUSCULAR | Status: AC
  Administered 2014-12-03 (×4): 10 mg via INTRAVENOUS
  Filled 2014-12-03 (×4): qty 2

## 2014-12-03 NOTE — Discharge Summary (Signed)
[address]       [place] [address]             [phone]              Discharge Summary  Name: Andrew Roth DOB: Dec 17, 1970 44 y.o. MRN: [id number]   Admission Date: 12/01/2014 Discharge Date: 12/07/2014    Admitting Diagnosis: Left lower lobe mass   Discharge Diagnosis:  Invasive well-differentiated adenocarcinoma, left lower lobe (T3, N0)- Stage IIB  Past Medical History  Diagnosis Date  . Depression   . Arthritis   . Anxiety     takes Cymbalta daily  . Asthma     Symbicort inhaler  . PONV (postoperative nausea and vomiting)   . Shortness of breath dyspnea     occasionally and with exertion  . Pneumonia     continuously for 66yr   . History of bronchitis     month and a half ago  . Cough     productive  . Joint pain   . History of gout   . GERD (gastroesophageal reflux disease)     occasionally but no issues in yrs   . Diabetes mellitus without complication     borderline     Procedures: LEFT VIDEO ASSISTED THORACOSCOPY -  12/01/2014  THORACOSCOPIC LEFT LOWER LOBECTOMY  MEDIASTINAL LYMPH NODE DISSECTION  CRYOANALGESIA  INTERCOSTAL NERVES     HPI:  The patient is a 44y.o. male lifelong nonsmoker who has been having respiratory problems for the past 4 years. He says that 4 years ago he was diagnosed with pneumonia. He has a chronic cough and shortness of breath. The cough is productive and usually is clear. In January, he had an episode with a worsening cough and fevers. A chest x-ray was done and showed left lower lobe infiltrate. He was treated with antibiotics. His symptoms improved with antibiotics, but after treatment, a chest x-ray showed a persistent infiltrate. A CT scan done on 06/16/2014 showed a cavitating mass with air bronchograms in the left lower lobe. This was felt to be consistent with pneumonia with a possible developing lung abscess.  He was referred to Dr. WMelvyn Novas who did a bronchoscopy. It showed no  endobronchial mass lesions. Cytology was negative for malignant cells. AFB and fungal stains were negative. Bacterial culture showed only normal flora.  He continues to have a productive cough and shortness of breath with exertion. A repeat chest x-ray in March showed a persistent infiltrate in the left lower lobe. His weight has been stable. He works in his own pressure washing business. His wife is a nMarine scientist He has 5 children. He is a lifelong nonsmoker.  A PET/CT was done which showed progression of the consolidative process of the left lower lobe. There was a cavitary area with moderate hypermetabolic activity. No definite tumor was seen.  The patient was referred to Dr. HRoxan Hockeyfor consideration of surgical resection. He performed a repeat bronchoscopy last week. There were no endobronchial mass lesions. Multiple biopsies, brushings, and aspirations were performed both for pathology and microbiology.  It was recommended that he proceed at this time with a left VATS for lower lobectomy. All risks, benefits and alternatives of surgery were explained in detail, and the patient agreed to proceed.     [facility]ospital Course:  [facility] The patient was taken to the operating room and underwent the above procedure.    The  postoperative course has generally been uneventful.  The anterior chest tube was removed on postop day 2, however, the posterior tube remained in place due to high serous output. This has decreased over time and the chest tube was ultimately able to be removed on 12/07/2014.  Follow up chest x-rays have been unremarkable.  Incisions are healing well. The patient is ambulating in the halls without difficulty and tolerating a regular diet. Final pathology was positive for invasive well differentiated adenocarcinoma (T3, N0, stage IIIB).  Overall, the patient is medically stable on today's date for discharge home.     Recent vital signs:  Filed  Vitals:   12/07/14 0739  BP: 139/92  Pulse: 92  Temp: 98.2 F (36.8 C)  Resp: 21    Recent laboratory studies:  CBC:No results for input(s): WBC, HGB, HCT, PLT in the last 72 hours. BMET: No results for input(s): NA, K, CL, CO2, GLUCOSE, BUN, CREATININE, CALCIUM in the last 72 hours.  PT/INR: No results for input(s): LABPROT, INR in the last 72 hours.   Discharge Medications:     Medication List    TAKE these medications        acetaminophen 325 MG tablet  Commonly known as:  TYLENOL  Take 650 mg by mouth every 6 (six) hours as needed for moderate pain.     budesonide-formoterol 80-4.5 MCG/ACT inhaler  Commonly known as:  SYMBICORT  Inhale 2 puffs into the lungs every 12 (twelve) hours. Take 2 puffs first thing in am and then another 2 puffs about 12 hours later.     CO Q 10 PO  Take 1 capsule by mouth daily.     DULoxetine 60 MG capsule  Commonly known as:  CYMBALTA  Take 60 mg by mouth daily.     Fish Oil 1200 MG Caps  Take 1,200 mg by mouth daily.     oxyCODONE 5 MG immediate release tablet  Commonly known as:  Oxy IR/ROXICODONE  Take 1-2 tablets (5-10 mg total) by mouth every 3 (three) hours as needed for severe pain.     RED YEAST RICE PO  Take 2 tablets by mouth daily.         Discharge Instructions:  The patient is to refrain from driving, heavy lifting or strenuous activity.  May shower daily and clean incisions with soap and water.  May resume regular diet.   Follow Up: Follow-up Information    Follow up with [name], MD On 01/04/2015.   Specialty:  Cardiothoracic Surgery   Why:  Have a chest x-ray at [facility] at 8:45, then see MD at 9:45    Contact information:   [address] [place] [address] [phone]       Follow up with [facility] RN  On 12/15/2014.   Why:  For suture removal at 10:00 with Dr. Leonarda Salon nurse        12/07/2014, 11:44 AM

## 2014-12-03 NOTE — Progress Notes (Signed)
2 Days Post-Op Procedure(s) (LRB): VIDEO ASSISTED THORACOSCOPY (VATS)/ LOBECTOMY (Left) CRYO INTERCOSTAL NERVE BLOCK (N/A) Subjective: Feels better today Some nausea yesterday but none so far today  Objective: Vital signs in last 24 hours: Temp:  [98 F (36.7 C)-99.4 F (37.4 C)] 98.2 F (36.8 C) (07/08 0527) Pulse Rate:  [93-109] 98 (07/08 0527) Cardiac Rhythm:  [-] Normal sinus rhythm (07/07 2022) Resp:  [16-36] 21 (07/08 0527) BP: (109-134)/(66-80) 121/78 mmHg (07/08 0527) SpO2:  [87 %-95 %] 92 % (07/08 0527) Arterial Line BP: (100-119)/(57-74) 100/74 mmHg (07/07 1435)  Hemodynamic parameters for last 24 hours:    Intake/Output from previous day: 07/07 0701 - 07/08 0700 In: 675 [I.V.:675] Out: 1375 [Urine:925; Chest Tube:450] Intake/Output this shift: Total I/O In: -  Out: 380 [Chest Tube:380]  General appearance: alert and no distress Neurologic: intact Heart: regular rate and rhythm Lungs: diminished breath sounds bibasilar Abdomen: distended, tympanitic, nontender no air leak, serous drainage  Lab Results:  Recent Labs  12/02/14 0320 12/03/14 0520  WBC 10.1 8.8  HGB 13.7 12.5*  HCT 38.6* 36.8*  PLT 236 225   BMET:  Recent Labs  12/02/14 0320 12/03/14 0520  NA 135 136  K 3.8 3.8  CL 104 103  CO2 26 27  GLUCOSE 132* 114*  BUN 8 8  CREATININE 0.80 1.00  CALCIUM 7.9* 7.1*    PT/INR:  Recent Labs  11/30/14 1043  LABPROT 13.1  INR 0.97   ABG    Component Value Date/Time   PHART 7.377 12/02/2014 0320   HCO3 25.5* 12/02/2014 0320   TCO2 26.9 12/02/2014 0320   ACIDBASEDEF 1.0 11/30/2014 1100   O2SAT 95.1 12/02/2014 0320   CBG (last 3)   Recent Labs  11/30/14 1032 12/01/14 0659 12/01/14 1424  GLUCAP 95 114* 159*    Assessment/Plan: S/P Procedure(s) (LRB): VIDEO ASSISTED THORACOSCOPY (VATS)/ LOBECTOMY (Left) CRYO INTERCOSTAL NERVE BLOCK (N/A) POD # 2  Doing well overall No air leak- dc anterior tube. Dc posterior tube when  drainage decreases Continue dilaudid PCA Gastric dilatation on CXR more notable today- reglan, cautioned to limit PO intake SCD + enoxaparin for DVT prophylaxis   LOS: 2 days    Andrew Roth 12/03/2014

## 2014-12-03 NOTE — Progress Notes (Signed)
      [address]       [place] [address]             [phone]      PATH- T3N0 stage IIB well differentiated adenocarcinoma  Will need oncology consult as outpatient  D/w patient  Revonda Standard. Roxan Hockey, MD [facility] [phone]

## 2014-12-04 ENCOUNTER — Inpatient Hospital Stay (HOSPITAL_COMMUNITY): Payer: Commercial Managed Care - PPO

## 2014-12-04 NOTE — Progress Notes (Addendum)
       [address]       ,[place] [address]             [phone]          3 Days Post-Op Procedure(s) (LRB): VIDEO ASSISTED THORACOSCOPY (VATS)/ LOBECTOMY (Left) CRYO INTERCOSTAL NERVE BLOCK (N/A)  Subjective: Feels better today.  Walked in halls this am.  Passing flatus, still with poor appetite.   Objective: Vital signs in last 24 hours: Patient Vitals for the past 24 hrs:  BP Temp Temp src Pulse Resp SpO2  12/04/14 0500 126/81 mmHg 98.6 F (37 C) Oral 97 (!) 25 95 %  12/04/14 0400 - - - - 16 93 %  12/04/14 0125 - - - - 14 93 %  12/03/14 2307 (!) 151/82 mmHg 97.9 F (36.6 C) Oral 92 (!) 23 95 %  12/03/14 2041 - - - - 16 95 %  12/03/14 2038 (!) 144/79 mmHg 98.8 F (37.1 C) Oral 99 (!) 25 98 %  12/03/14 1642 135/86 mmHg 97.8 F (36.6 C) Oral 95 (!) 31 91 %  12/03/14 1503 - - - - (!) 22 97 %  12/03/14 1214 - - - - (!) 30 92 %  12/03/14 1203 - 98.4 F (36.9 C) Oral - - -  12/03/14 1155 132/77 mmHg - - - (!) 26 -  12/03/14 0809 136/76 mmHg - - 97 (!) 26 97 %  12/03/14 0805 - - - - - 96 %  12/03/14 0800 - 99 F (37.2 C) Oral - (!) 29 95 %   Current Weight  12/01/14 213 lb 3 oz (96.7 kg)     Intake/Output from previous day: 07/08 0701 - 07/09 0700 In: 1200 [I.V.:1200] Out: 2790 [Urine:1900; Chest Tube:890]    PHYSICAL EXAM:  Heart: RRR Lungs: Decreased BS L base Wound: Clean and dry Chest tube: No air leak    Lab Results: CBC: Recent Labs  12/02/14 0320 12/03/14 0520  WBC 10.1 8.8  HGB 13.7 12.5*  HCT 38.6* 36.8*  PLT 236 225   BMET:  Recent Labs  12/02/14 0320 12/03/14 0520  NA 135 136  K 3.8 3.8  CL 104 103  CO2 26 27  GLUCOSE 132* 114*  BUN 8 8  CREATININE 0.80 1.00  CALCIUM 7.9* 7.1*    PT/INR: No results for input(s): LABPROT, INR in the last 72 hours.    Assessment/Plan: S/P Procedure(s) (LRB): VIDEO ASSISTED THORACOSCOPY (VATS)/ LOBECTOMY (Left) CRYO INTERCOSTAL NERVE BLOCK (N/A) CT output 890 ml  over past 24 hrs. CXR stable.  Will continue CT to water seal until drainage decreases. GI- passing flatus, nausea resolved. Continue to monitor.  Pulm- Encouraged IS/pulm toilet. Ambulate in halls.   LOS: 3 days    COLLINS,GINA H 12/04/2014  Leave ct for drainage today I have seen and examined Andrew Roth and agree with the above assessment  and plan.  Grace Isaac MD Beeper [phone] Office [phone] 12/04/2014 11:15 AM

## 2014-12-05 ENCOUNTER — Inpatient Hospital Stay (HOSPITAL_COMMUNITY): Payer: Commercial Managed Care - PPO

## 2014-12-05 LAB — TISSUE CULTURE: Culture: NO GROWTH

## 2014-12-05 NOTE — Progress Notes (Addendum)
[address]       [place] [address]             [phone]          4 Days Post-Op Procedure(s) (LRB): VIDEO ASSISTED THORACOSCOPY (VATS)/ LOBECTOMY (Left) CRYO INTERCOSTAL NERVE BLOCK (N/A)  Subjective: Feels better today.  Breathing stable, off O2.  Appetite a little better. Walking in halls without problem.   Objective: Vital signs in last 24 hours: Patient Vitals for the past 24 hrs:  BP Temp Temp src Pulse Resp SpO2  12/05/14 0735 - - - - 19 96 %  12/05/14 0734 136/81 mmHg - - 93 (!) 31 96 %  12/05/14 0402 130/79 mmHg 98.5 F (36.9 C) Oral 90 (!) 23 93 %  12/05/14 0400 - - - - 16 92 %  12/05/14 0010 130/81 mmHg 97.9 F (36.6 C) Oral 84 18 96 %  12/04/14 2330 - - - - (!) 24 97 %  12/04/14 2005 - - - - 18 93 %  12/04/14 1955 134/82 mmHg 98.3 F (36.8 C) Oral 98 (!) 22 91 %  12/04/14 1600 - - - (!) 101 (!) 32 90 %  12/04/14 1544 - - - - (!) 31 96 %  12/04/14 1500 - 98.4 F (36.9 C) Oral 100 (!) 22 94 %  12/04/14 1200 - - - - (!) 24 92 %  12/04/14 1100 - 98.2 F (36.8 C) Oral - - -  12/04/14 1050 125/77 mmHg - - 93 (!) 24 94 %  12/04/14 0841 - - - - - 95 %  12/04/14 0800 - 99 F (37.2 C) Oral 87 (!) 21 100 %   Current Weight  12/01/14 213 lb 3 oz (96.7 kg)     Intake/Output from previous day: 07/09 0701 - 07/10 0700 In: 650.4 [P.O.:240; I.V.:410.4] Out: 1475 [Urine:975; Chest Tube:500]    PHYSICAL EXAM:  Heart: RRR Lungs: Decreased BS L base Wound: Clean and dry Chest tube: No air leak    Lab Results: CBC: Recent Labs  12/03/14 0520  WBC 8.8  HGB 12.5*  HCT 36.8*  PLT 225   BMET:  Recent Labs  12/03/14 0520  NA 136  K 3.8  CL 103  CO2 27  GLUCOSE 114*  BUN 8  CREATININE 1.00  CALCIUM 7.1*    PT/INR: No results for input(s): LABPROT, INR in the last 72 hours.   Assessment/Plan: S/P Procedure(s) (LRB): VIDEO ASSISTED THORACOSCOPY (VATS)/ LOBECTOMY (Left) CRYO INTERCOSTAL NERVE BLOCK (N/A) CT  output 500 ml serous fluid over past 24 hrs, but decreasing overall. CXR stable. Will continue CT to water seal until drainage decreases. Continue IS, ambulation. Pt not using PCA and would like to get central line out.  Will place PIV and d/c central line.   LOS: 4 days    COLLINS,GINA H 12/05/2014  Dg Chest Port 1 View  12/05/2014   CLINICAL DATA:  Left lung surgery for lung cancer.  EXAM: PORTABLE CHEST - 1 VIEW  COMPARISON:  12/04/2014 prior studies  FINDINGS: Left-sided postoperative changes are again noted.  A right IJ central venous catheter with tip overlying the mid SVC and a left thoracostomy tube again noted.  A 5% left apical pneumothorax is noted.  Slightly increased subsegmental atelectasis within the mid -lower right lung.  Improved airspace disease/atelectasis within the left lung are noted.  IMPRESSION: Very small left apical pneumothorax now noted.  Slightly increasing subsegmental atelectasis within  the mid -lower right lung.  Improved airspace disease/ atelectasis within the left lung.   Electronically Signed   By: Margarette Canada M.D.   On: 12/05/2014 09:45    Still drainage from chest tube will leave in  I have seen and examined Andrew Roth and agree with the above assessment  and plan.  Grace Isaac MD Beeper [phone] Office [phone] 12/05/2014 11:27 AM

## 2014-12-05 NOTE — Care Management Note (Signed)
Case Management Note  Patient Details  Name: YOSHI MANCILLAS MRN: [id number] Date of Birth: 15-Apr-1971  Subjective/Objective:                 PTA from home with wife,  s/p VAT's/ L lobectomy.  Action/Plan: Return to home when medically stable. CM to f/u with disposition needs.  Expected Discharge Date:                  Expected Discharge Plan:  Home/Self Care  In-House Referral:     Discharge planning Services  CM Consult  Post Acute Care Choice:    Choice offered to:     DME Arranged:    DME Agency:     HH Arranged:    HH Agency:     Status of Service:  In process, will continue to follow  Medicare Important Message Given:    Date Medicare IM Given:    Medicare IM give by:    Date Additional Medicare IM Given:    Additional Medicare Important Message give by:     If discussed at [place] of Stay Meetings, dates discussed:    Additional Comments: Drexler Maland (Spouse)[phone],  Diannia Ruder  (Mother)  [phone]  [place]man Hero [place], [place] [phone] 12/05/2014, 6:35 PM

## 2014-12-05 NOTE — Progress Notes (Signed)
Patients fentanyl vial wasted per protocol witnessed by RN Melvenia Needles.

## 2014-12-06 ENCOUNTER — Inpatient Hospital Stay (HOSPITAL_COMMUNITY): Payer: Commercial Managed Care - PPO

## 2014-12-06 NOTE — Progress Notes (Signed)
UR COMPLETED  

## 2014-12-06 NOTE — Progress Notes (Signed)
5 Days Post-Op Procedure(s) (LRB): VIDEO ASSISTED THORACOSCOPY (VATS)/ LOBECTOMY (Left) CRYO INTERCOSTAL NERVE BLOCK (N/A) Subjective: No complaints Pain well controlled  Objective: Vital signs in last 24 hours: Temp:  [97.2 F (36.2 C)-98.6 F (37 C)] 98.6 F (37 C) (07/11 0700) Pulse Rate:  [77-99] 77 (07/11 0421) Cardiac Rhythm:  [-] Normal sinus rhythm (07/11 0421) Resp:  [21-39] 25 (07/11 0421) BP: (124-140)/(78-88) 135/88 mmHg (07/11 0421) SpO2:  [95 %-97 %] 97 % (07/11 0421)  Hemodynamic parameters for last 24 hours:    Intake/Output from previous day: 07/10 0701 - 07/11 0700 In: 720 [P.O.:720] Out: 2460 [Urine:2050; Chest Tube:410] Intake/Output this shift: Total I/O In: 1 [P.O.:1] Out: 520 [Urine:450; Chest Tube:70]  General appearance: alert and no distress Neurologic: intact Heart: regular rate and rhythm Lungs: diminished breath sounds left base Wound: clean and dry, serous drainage from CT  Lab Results: No results for input(s): WBC, HGB, HCT, PLT in the last 72 hours. BMET: No results for input(s): NA, K, CL, CO2, GLUCOSE, BUN, CREATININE, CALCIUM in the last 72 hours.  PT/INR: No results for input(s): LABPROT, INR in the last 72 hours. ABG    Component Value Date/Time   PHART 7.377 12/02/2014 0320   HCO3 25.5* 12/02/2014 0320   TCO2 26.9 12/02/2014 0320   ACIDBASEDEF 1.0 11/30/2014 1100   O2SAT 95.1 12/02/2014 0320   CBG (last 3)  No results for input(s): GLUCAP in the last 72 hours.  Assessment/Plan: S/P Procedure(s) (LRB): VIDEO ASSISTED THORACOSCOPY (VATS)/ LOBECTOMY (Left) CRYO INTERCOSTAL NERVE BLOCK (N/A) -  CT output remains relatively high but is trending down Will leave CT in today Continue ambulation Pain well controlled   LOS: 5 days    [name] 12/06/2014

## 2014-12-07 ENCOUNTER — Inpatient Hospital Stay (HOSPITAL_COMMUNITY): Payer: Commercial Managed Care - PPO

## 2014-12-07 MED ORDER — OXYCODONE HCL 5 MG PO TABS: 5.0000 mg | ORAL_TABLET | ORAL | Status: DC | PRN

## 2014-12-07 MED ORDER — BUDESONIDE-FORMOTEROL FUMARATE 80-4.5 MCG/ACT IN AERO: 2.0000 | INHALATION_SPRAY | Freq: Two times a day (BID) | RESPIRATORY_TRACT | Status: DC

## 2014-12-07 NOTE — Progress Notes (Signed)
       [address]       [place] [address]             [phone]          6 Days Post-Op Procedure(s) (LRB): VIDEO ASSISTED THORACOSCOPY (VATS)/ LOBECTOMY (Left) CRYO INTERCOSTAL NERVE BLOCK (N/A)  Subjective: Feels well, no complaints.   Objective: Vital signs in last 24 hours: Patient Vitals for the past 24 hrs:  BP Temp Temp src Pulse Resp SpO2  12/07/14 0501 128/80 mmHg 98.3 F (36.8 C) Oral 86 20 98 %  12/06/14 2346 - 98.5 F (36.9 C) Oral - - -  12/06/14 2345 129/76 mmHg - - 84 (!) 24 98 %  12/06/14 1951 - 98.9 F (37.2 C) Oral - - -  12/06/14 1939 - - - - - 94 %  12/06/14 1920 133/90 mmHg - - (!) 101 - 98 %  12/06/14 1540 133/87 mmHg 98.8 F (37.1 C) Oral 82 (!) 27 94 %  12/06/14 1150 135/87 mmHg - - 79 (!) 36 98 %  12/06/14 1130 - 98.1 F (36.7 C) Oral - - -  12/06/14 0844 - - - - - 95 %   Current Weight  12/01/14 213 lb 3 oz (96.7 kg)     Intake/Output from previous day: 07/11 0701 - 07/12 0700 In: 361 [P.O.:361] Out: 2020 [Urine:1750; Chest Tube:270]    PHYSICAL EXAM:  Heart: RRR Lungs: Slightly decreased in left base Wound: Clean and dry Chest tube: No air leak    Lab Results: CBC:No results for input(s): WBC, HGB, HCT, PLT in the last 72 hours. BMET: No results for input(s): NA, K, CL, CO2, GLUCOSE, BUN, CREATININE, CALCIUM in the last 72 hours.  PT/INR: No results for input(s): LABPROT, INR in the last 72 hours.  CXR: FINDINGS: The left-sided chest tube is stable. Suspect a tiny residual left apical pneumothorax. Persistent atelectasis, and edema on the left side. The right lung remains relatively clear. Stable area of subsegmental atelectasis.  IMPRESSION: Stable left-sided chest tube with small apical pneumothorax.  Persistent edema, atelectasis and effusion on the left.   Assessment/Plan: S/P Procedure(s) (LRB): VIDEO ASSISTED THORACOSCOPY (VATS)/ LOBECTOMY (Left) CRYO INTERCOSTAL NERVE BLOCK  (N/A) CT output continues to decrease (410->270), but remains a little high.  Continue CT for now. Hopefully can d/c soon. Continue pulm toilet, ambulation.   LOS: 6 days    Halston Kintz H 12/07/2014

## 2014-12-07 NOTE — Progress Notes (Signed)
DC instructions given to patient and spouse. Questions answered, Rx given. PIV DC, hemostasis achieved. VSS. eICU and CCMD notified of discharge. Pt educated on s+s of infection, IS use, f/u appts, bathing and driving restrictions. L CT site, CDI. Will continue to monitor until time of DC. Pt escorted by RN via ambulatory status to private vehicle driven by spouse. All belonging sent with patient.

## 2014-12-07 NOTE — Progress Notes (Signed)
6 Days Post-Op Procedure(s) (LRB): VIDEO ASSISTED THORACOSCOPY (VATS)/ LOBECTOMY (Left) CRYO INTERCOSTAL NERVE BLOCK (N/A) Subjective: No complaints  Objective: Vital signs in last 24 hours: Temp:  [98.1 F (36.7 C)-98.9 F (37.2 C)] 98.2 F (36.8 C) (07/12 0739) Pulse Rate:  [79-101] 92 (07/12 0739) Cardiac Rhythm:  [-] Normal sinus rhythm (07/12 0739) Resp:  [20-36] 21 (07/12 0739) BP: (128-139)/(76-92) 139/92 mmHg (07/12 0739) SpO2:  [94 %-98 %] 96 % (07/12 0739)  Hemodynamic parameters for last 24 hours:    Intake/Output from previous day: 07/11 0701 - 07/12 0700 In: 361 [P.O.:361] Out: 2020 [Urine:1750; Chest Tube:270] Intake/Output this shift:    General appearance: alert and cooperative Neurologic: intact Heart: regular rate and rhythm Lungs: diminished breath sounds left base Wound: clean and dry  Lab Results: No results for input(s): WBC, HGB, HCT, PLT in the last 72 hours. BMET: No results for input(s): NA, K, CL, CO2, GLUCOSE, BUN, CREATININE, CALCIUM in the last 72 hours.  PT/INR: No results for input(s): LABPROT, INR in the last 72 hours. ABG    Component Value Date/Time   PHART 7.377 12/02/2014 0320   HCO3 25.5* 12/02/2014 0320   TCO2 26.9 12/02/2014 0320   ACIDBASEDEF 1.0 11/30/2014 1100   O2SAT 95.1 12/02/2014 0320   CBG (last 3)  No results for input(s): GLUCAP in the last 72 hours.  Assessment/Plan: S/P Procedure(s) (LRB): VIDEO ASSISTED THORACOSCOPY (VATS)/ LOBECTOMY (Left) CRYO INTERCOSTAL NERVE BLOCK (N/A) -  Doing well  CT drainage down- dc CT  Home later today  F/u with oncology as outpatient   LOS: 6 days    [name] 12/07/2014

## 2014-12-15 ENCOUNTER — Other Ambulatory Visit: Payer: Self-pay | Admitting: *Deleted

## 2014-12-15 DIAGNOSIS — Z9889 Other specified postprocedural states: Secondary | ICD-10-CM

## 2014-12-15 DIAGNOSIS — G8918 Other acute postprocedural pain: Secondary | ICD-10-CM

## 2014-12-15 MED ORDER — OXYCODONE HCL 5 MG PO TABS: 5.0000 mg | ORAL_TABLET | ORAL | Status: DC | PRN

## 2015-01-01 LAB — FUNGUS CULTURE W SMEAR: FUNGAL SMEAR: NONE SEEN

## 2015-01-03 ENCOUNTER — Other Ambulatory Visit: Payer: Self-pay | Admitting: Thoracic Surgery (Cardiothoracic Vascular Surgery)

## 2015-01-03 DIAGNOSIS — Z902 Acquired absence of lung [part of]: Secondary | ICD-10-CM

## 2015-01-04 ENCOUNTER — Ambulatory Visit (INDEPENDENT_AMBULATORY_CARE_PROVIDER_SITE_OTHER): Payer: Self-pay | Admitting: Thoracic Surgery (Cardiothoracic Vascular Surgery)

## 2015-01-04 ENCOUNTER — Encounter: Payer: Self-pay | Admitting: Thoracic Surgery (Cardiothoracic Vascular Surgery)

## 2015-01-04 ENCOUNTER — Ambulatory Visit
Admission: RE | Admit: 2015-01-04 | Discharge: 2015-01-04 | Disposition: A | Discharge status: Home / Self Care | Payer: Commercial Managed Care - PPO | Source: Ambulatory Visit | Attending: Thoracic Surgery (Cardiothoracic Vascular Surgery) | Admitting: Thoracic Surgery (Cardiothoracic Vascular Surgery)

## 2015-01-04 VITALS — BP 127/90 | HR 85 | Resp 20 | Ht 70.0 in | Wt 208.0 lb

## 2015-01-04 DIAGNOSIS — Z902 Acquired absence of lung [part of]: Secondary | ICD-10-CM

## 2015-01-04 DIAGNOSIS — Z9889 Other specified postprocedural states: Secondary | ICD-10-CM

## 2015-01-04 DIAGNOSIS — C3490 Malignant neoplasm of unspecified part of unspecified bronchus or lung: Secondary | ICD-10-CM | POA: Insufficient documentation

## 2015-01-04 DIAGNOSIS — C3492 Malignant neoplasm of unspecified part of left bronchus or lung: Secondary | ICD-10-CM

## 2015-01-04 NOTE — Progress Notes (Signed)
[address]       [place] [address]             [phone]       HPI:  44 yo man who had a left lower lobectomy on 12/01/2014. His postoperative course was uncomplicated. 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  He has been doing well. He is having minimal incisional discomfort, and is not taking any narcotic pain medications. He has been driving without difficulty and is back at work. He does notice he get short of breath more easily than preoperatively.  Past Medical History  Diagnosis Date  . Depression   . Arthritis   . Anxiety     takes Cymbalta daily  . Asthma     Symbicort inhaler  . PONV (postoperative nausea and vomiting)   . Shortness of breath dyspnea     occasionally and with exertion  . Pneumonia     continuously for 40yr   . History of bronchitis     month and a half ago  . Cough     productive  . Joint pain   . History of gout   . GERD (gastroesophageal reflux disease)     occasionally but no issues in yrs   . Diabetes mellitus without complication     borderline   Past Surgical History  Procedure Laterality Date  . Video bronchoscopy Bilateral 08/16/2014    Procedure: VIDEO BRONCHOSCOPY WITHOUT FLUORO;  Surgeon: MTanda Rockers MD;  Location: [facility];  Service: Endoscopy;  Laterality: Bilateral;  . Video bronchoscopy N/A 10/01/2014    Procedure: VIDEO BRONCHOSCOPY;  Surgeon: SMelrose Nakayama MD;  [facility]  Service: Thoracic;  Laterality: N/A;  . Wisdom teeth extracted    . Back surgery  2001  . Back surgery    . Knee arthroscopy Left     x 3  . Joint replacement Left     knee  . Hernia repair Bilateral   . Video assisted thoracoscopy (vats)/ lobectomy Left 12/01/2014    Procedure: VIDEO ASSISTED THORACOSCOPY (VATS)/ LOBECTOMY;  Surgeon: SMelrose Nakayama MD;  [facility] [facility]  Service: Thoracic;  Laterality: Left;  . Cryo intercostal nerve block N/A 12/01/2014    Procedure: CRYO  INTERCOSTAL NERVE BLOCK;  Surgeon: SMelrose Nakayama MD;  [facility] [facility]  Service: Thoracic;  Laterality: N/A;      Current Outpatient Prescriptions  Medication Sig Dispense Refill  . acetaminophen (TYLENOL) 325 MG tablet Take 650 mg by mouth every 6 (six) hours as needed for moderate pain.     . budesonide-formoterol (SYMBICORT) 80-4.5 MCG/ACT inhaler Inhale 2 puffs into the lungs every 12 (twelve) hours. Take 2 puffs first thing in am and then another 2 puffs about 12 hours later. 1 Inhaler 12  . Coenzyme Q10 (CO Q 10 PO) Take 1 capsule by mouth daily.    . DULoxetine (CYMBALTA) 60 MG capsule Take 60 mg by mouth daily.      . Omega-3 Fatty Acids (FISH OIL) 1200 MG CAPS Take 1,200 mg by mouth daily.    .Marland KitchenoxyCODONE (OXY IR/ROXICODONE) 5 MG immediate release tablet Take 1-2 tablets (5-10 mg total) by mouth every 3 (three) hours as needed for severe pain. 30 tablet 0  . Red Yeast Rice Extract (RED YEAST RICE PO) Take 2 tablets by mouth daily.     No current facility-administered medications for this visit.  Physical Exam BP 127/90 mmHg  Pulse 85  Resp 20  Ht '5\' 10"'$  (1.778 m)  Wt 208 lb (94.348 kg)  BMI 29.84 kg/m2  SpO2 98% Mr. Mato is a 44 year old man in no acute distress Well-developed and well-nourished Incisions well healed Cardiac regular rate and rhythm normal S1 and S2 Lungs diminished breath sounds in left base, otherwise clear  Diagnostic Tests: Chest x-ray reviewed and shows postoperative changes  Impression: 44 year old man with a stage II adenocarcinoma of the lower lobe. He is status post lobectomy. He needs postoperative adjuvant chemotherapy. I discussed options with him and he strongly wishes to go to [facility] to see Dr. Earl Gala. His wife works at [employer] and his insurance coverage is best if he stays in network.  He is recovering nicely. His exercise tolerance should improve with time. He is having minimal discomfort. There  are no restrictions on his activities.  Plan:  Referral to oncology for adjuvant chemotherapy for stage IIb adenocarcinoma  I will see him back in 2 months to check on his progress.  [name], MD [facility] [phone]

## 2015-01-10 DIAGNOSIS — C3432 Malignant neoplasm of lower lobe, left bronchus or lung: Secondary | ICD-10-CM | POA: Insufficient documentation

## 2015-01-13 LAB — AFB CULTURE WITH SMEAR (NOT AT ARMC): Acid Fast Smear: NONE SEEN

## 2015-02-10 DIAGNOSIS — Z736 Limitation of activities due to disability: Secondary | ICD-10-CM

## 2015-03-03 ENCOUNTER — Other Ambulatory Visit: Payer: Self-pay | Admitting: Thoracic Surgery (Cardiothoracic Vascular Surgery)

## 2015-03-03 DIAGNOSIS — C3490 Malignant neoplasm of unspecified part of unspecified bronchus or lung: Secondary | ICD-10-CM

## 2015-03-08 ENCOUNTER — Ambulatory Visit (INDEPENDENT_AMBULATORY_CARE_PROVIDER_SITE_OTHER): Payer: Commercial Managed Care - PPO | Admitting: Thoracic Surgery (Cardiothoracic Vascular Surgery)

## 2015-03-08 ENCOUNTER — Encounter: Payer: Self-pay | Admitting: Thoracic Surgery (Cardiothoracic Vascular Surgery)

## 2015-03-08 ENCOUNTER — Ambulatory Visit
Admission: RE | Admit: 2015-03-08 | Discharge: 2015-03-08 | Disposition: A | Discharge status: Home / Self Care | Payer: Commercial Managed Care - PPO | Source: Ambulatory Visit | Attending: Thoracic Surgery (Cardiothoracic Vascular Surgery) | Admitting: Thoracic Surgery (Cardiothoracic Vascular Surgery)

## 2015-03-08 VITALS — BP 150/90 | HR 90 | Resp 20 | Ht 70.0 in | Wt 215.0 lb

## 2015-03-08 DIAGNOSIS — C3492 Malignant neoplasm of unspecified part of left bronchus or lung: Secondary | ICD-10-CM

## 2015-03-08 DIAGNOSIS — C3490 Malignant neoplasm of unspecified part of unspecified bronchus or lung: Secondary | ICD-10-CM

## 2015-03-08 DIAGNOSIS — Z902 Acquired absence of lung [part of]: Secondary | ICD-10-CM | POA: Diagnosis not present

## 2015-03-08 NOTE — Progress Notes (Signed)
[address]       [place] [address]             [phone]       HPI:  Mr. Marinello returns today for a scheduled 3 month follow-up visit.  He is a 44 year old never smoker who had a left lower lobectomy on 12/01/2014 for a stage IIB (T3, N0) adenocarcinoma. His postoperative course was uncomplicated. He went back to work at 3 weeks postop.  He saw Dr. Rhona Raider in [facility]. He opted not to do chemotherapy.  He says he is feeling well. He has lost some weight due to diet and exercise. He denies any headaches or visual changes. He has no new bone or joint pain. Overall he feels well. He does not have any incisional pain.  Past Medical History  Diagnosis Date  . Depression   . Arthritis   . Anxiety     takes Cymbalta daily  . Asthma     Symbicort inhaler  . PONV (postoperative nausea and vomiting)   . Shortness of breath dyspnea     occasionally and with exertion  . Pneumonia     continuously for 30yr   . History of bronchitis     month and a half ago  . Cough     productive  . Joint pain   . History of gout   . GERD (gastroesophageal reflux disease)     occasionally but no issues in yrs   . Diabetes mellitus without complication ([facility]     borderline      Current Outpatient Prescriptions  Medication Sig Dispense Refill  . acetaminophen (TYLENOL) 325 MG tablet Take 650 mg by mouth every 6 (six) hours as needed for moderate pain.     . budesonide-formoterol (SYMBICORT) 80-4.5 MCG/ACT inhaler Inhale 2 puffs into the lungs every 12 (twelve) hours. Take 2 puffs first thing in am and then another 2 puffs about 12 hours later. 1 Inhaler 12  . Coenzyme Q10 (CO Q 10 PO) Take 1 capsule by mouth daily.    . DULoxetine (CYMBALTA) 60 MG capsule Take 60 mg by mouth daily.      . Omega-3 Fatty Acids (FISH OIL) 1200 MG CAPS Take 1,200 mg by mouth daily.    .Marland KitchenoxyCODONE (OXY IR/ROXICODONE) 5 MG immediate release tablet Take 1-2 tablets (5-10 mg total) by  mouth every 3 (three) hours as needed for severe pain. 30 tablet 0  . Red Yeast Rice Extract (RED YEAST RICE PO) Take 2 tablets by mouth daily.     No current facility-administered medications for this visit.    Physical Exam BP 150/90 mmHg  Pulse 90  Resp 20  Ht '5\' 10"'$  (1.778 m)  Wt 215 lb (97.523 kg)  BMI 30.85 kg/m2  [id number] 44year old man in no acute distress Well-developed well-nourished Alert and oriented 3 with no focal neurologic deficits No cervical or supraclavicular adenopathy Cardiac regular rate and rhythm normal S1 and S2 Lungs slightly diminished left base, otherwise clear Incision well-healed  Diagnostic Tests: I reviewed his chest x-ray. Shows postoperative changes. There is no evidence of active disease.  Impression: 44year old man who is now 3 months out from a left lower lobectomy for a T3, N0, stage IIB adenocarcinoma. He is a lifelong nonsmoker. [facility] After discussion he opted not to have chemotherapy.  He is doing extremely well from a surgical standpoint.  He has  a follow-up appointment scheduled with Dr. Rhona Raider.  Plan: Since he is in close proximity to Dr. Rhona Raider he prefers to do his follow-up with her. I would be happy to see him back any time the future if I can be of any assistance with his care.  [name], MD [facility] [phone]

## 2015-04-28 DIAGNOSIS — Z736 Limitation of activities due to disability: Secondary | ICD-10-CM | POA: Diagnosis not present

## 2015-10-18 DIAGNOSIS — C3432 Malignant neoplasm of lower lobe, left bronchus or lung: Secondary | ICD-10-CM | POA: Diagnosis not present

## 2016-04-25 DIAGNOSIS — C3432 Malignant neoplasm of lower lobe, left bronchus or lung: Secondary | ICD-10-CM | POA: Diagnosis not present

## 2016-08-04 IMAGING — CT CT PARANASAL SINUSES LIMITED
1 of 2 series · 16 of 19 positions shown, 20 images · non-contrast
Comparison: Head CT 04/29/2011

CLINICAL DATA: Chronic cough. Recurrent sinusitis. Recent
pneumonia. Initial encounter.

EXAM:
CT PARANASAL SINUS LIMITED WITHOUT CONTRAST
TECHNIQUE: Non-contiguous multidetector CT images of the paranasal sinuses were
obtained in a single plane without contrast.

[Series 4: ltd sinus 3.0 h30s · axial · 0.33mm/px · z∈[-117,-22]mm · 16 of 18 slices shown, 20 images]
[im 2/18  brain]
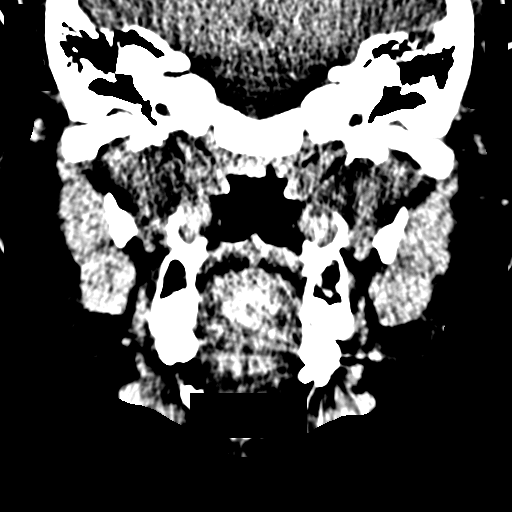
[im 2/18  bone]
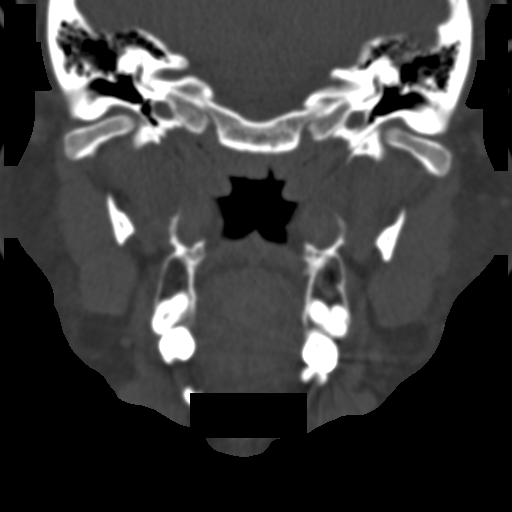
[im 3/18  bone]
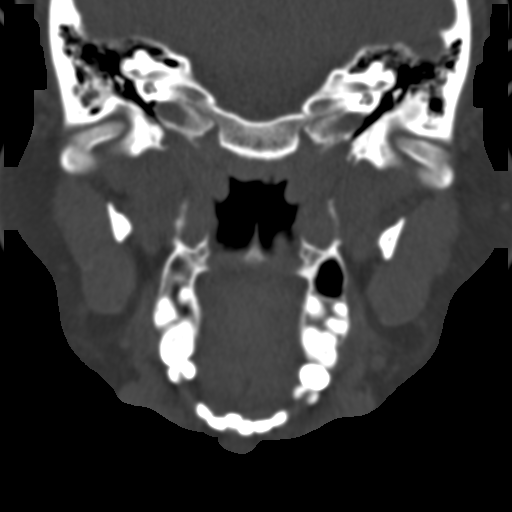
[im 4/18  bone]
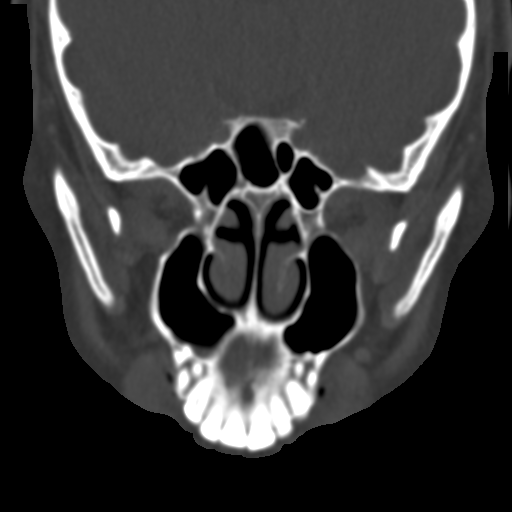
[im 5/18  bone]
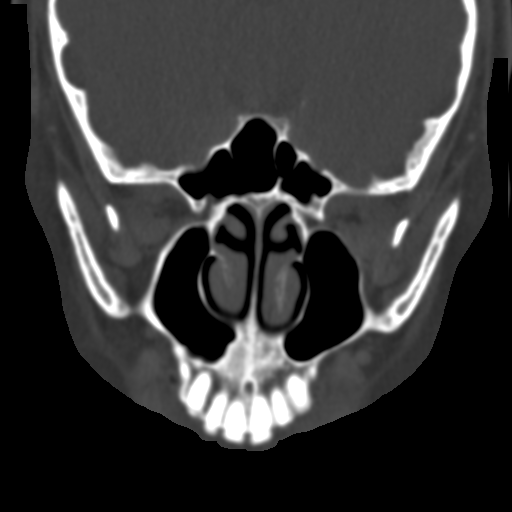
[im 6/18  brain]
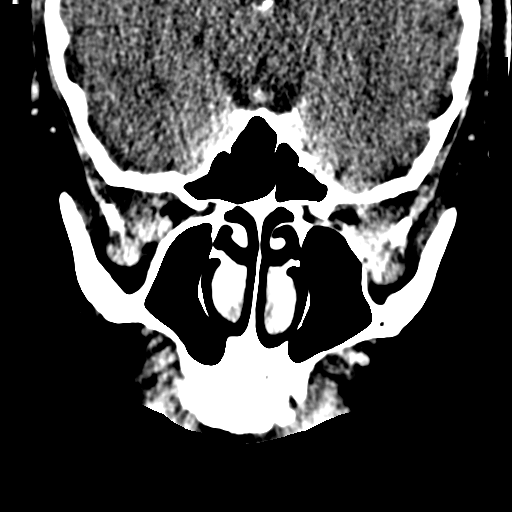
[im 6/18  bone]
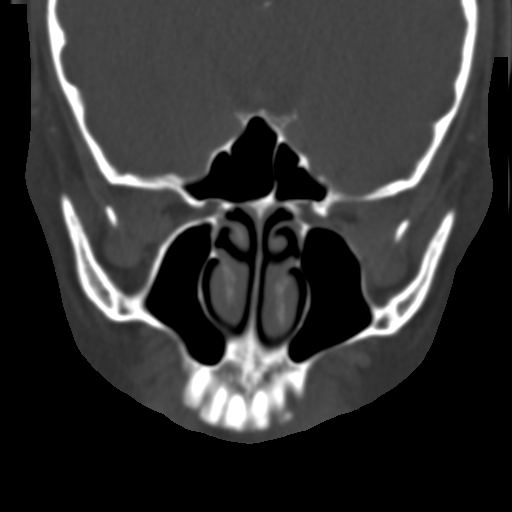
[im 7/18  bone]
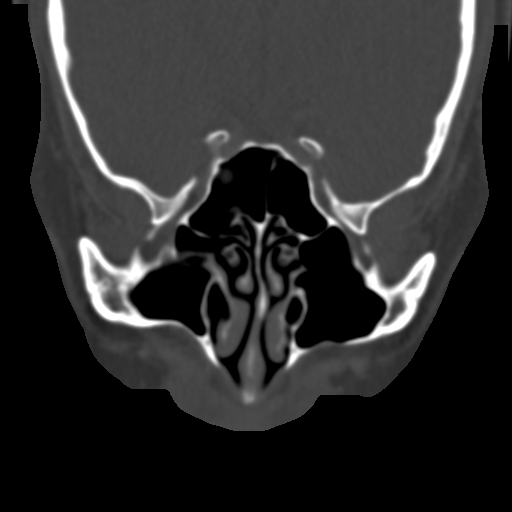
[im 8/18  bone]
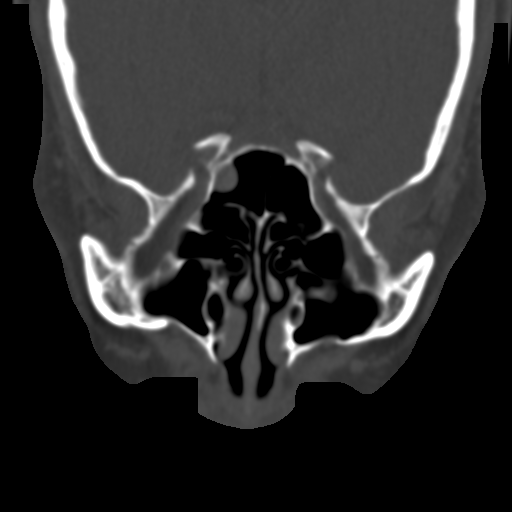
[im 9/18  bone]
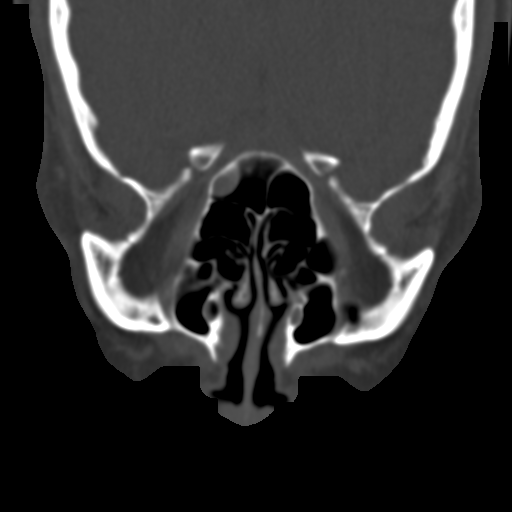
[im 10/18  brain]
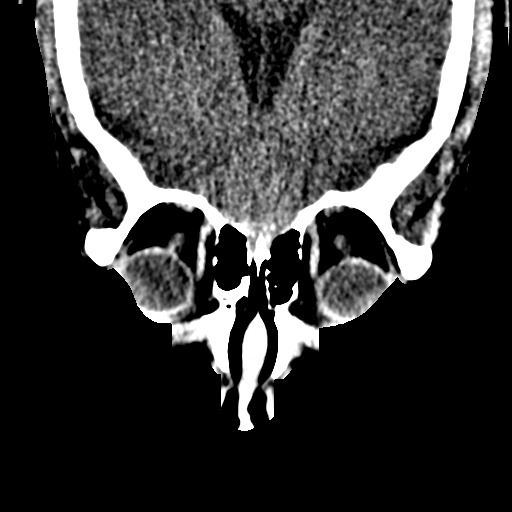
[im 10/18  bone]
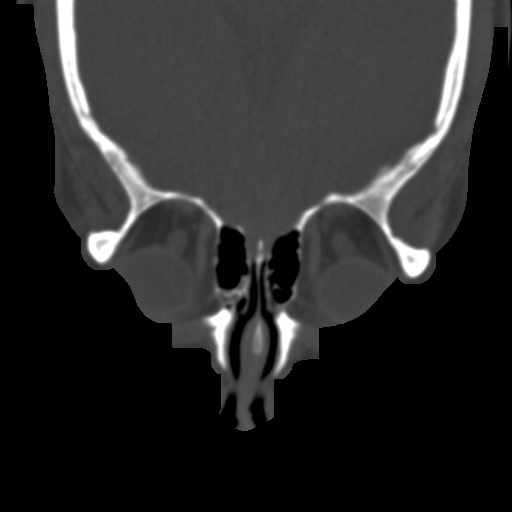
[im 11/18  bone]
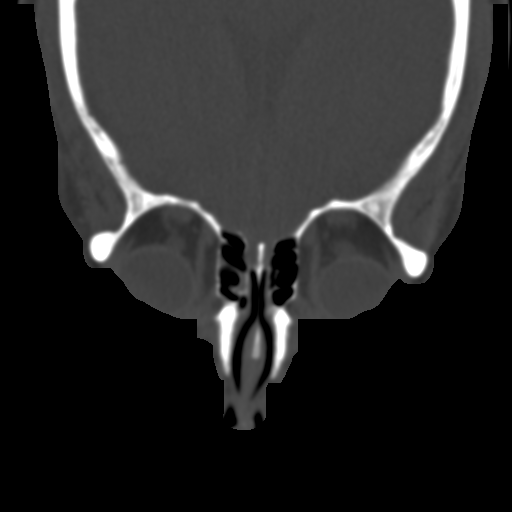
[im 12/18  bone]
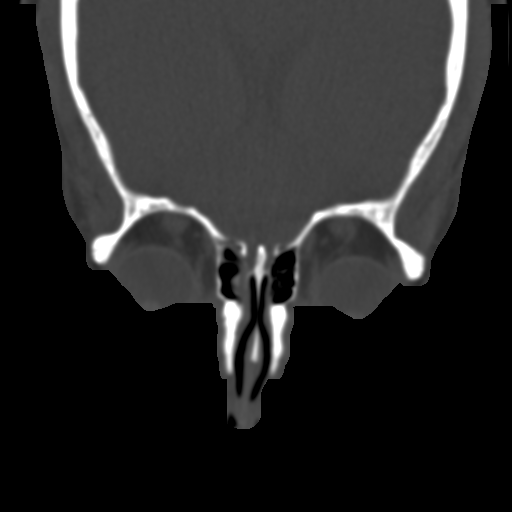
[im 13/18  bone]
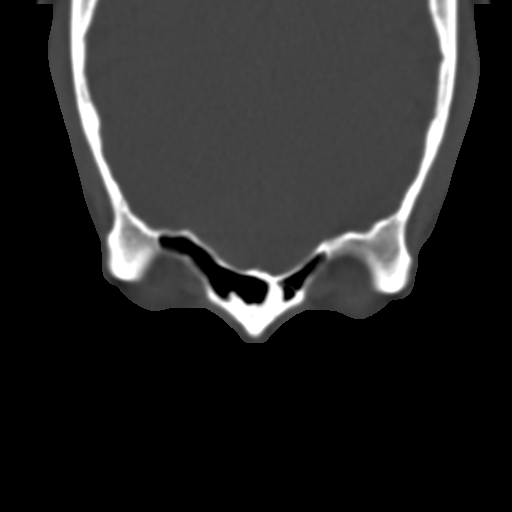
[im 14/18  brain]
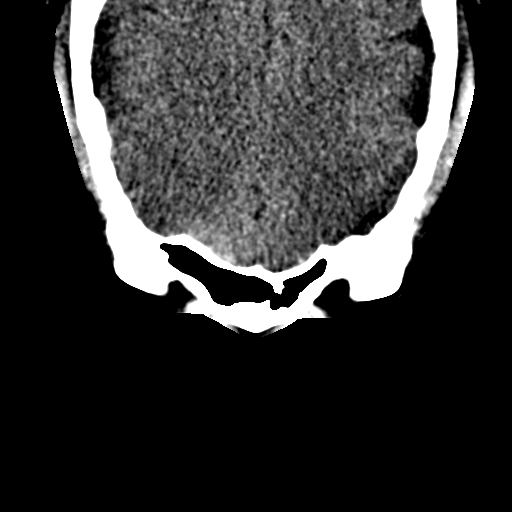
[im 14/18  bone]
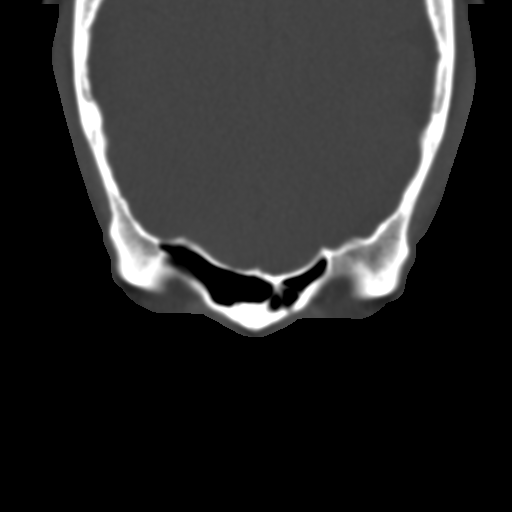
[im 15/18  bone]
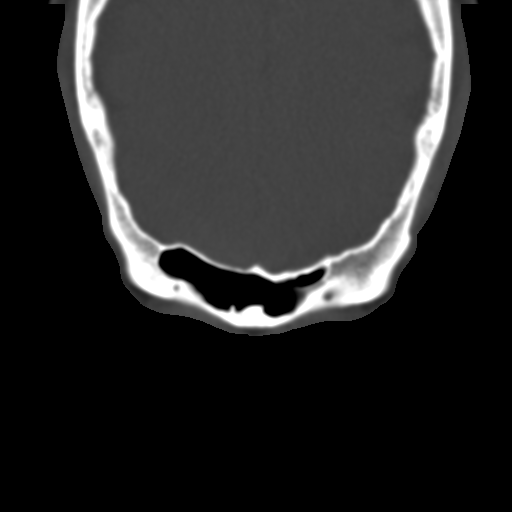
[im 16/18  bone]
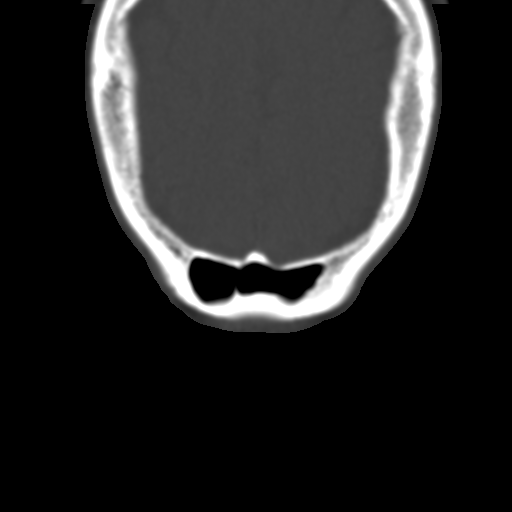
[im 17/18  bone]
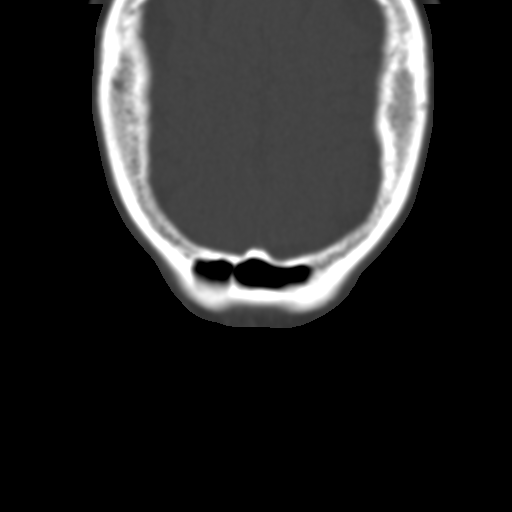

[16 of 19 positions shown; findings below may reference images not displayed]

FINDINGS: There is noted mucosal thickening within the maxillary sinuses. The
ethmoid air cells are clear. The ostiomeatal complexes are patent.
There is mild polypoid mucosal thickening in the right sphenoid
sinus.
IMPRESSION: No evidence acute sinusitis. Trace sinus inflammation in the right
sphenoid sinus.

## 2016-08-26 IMAGING — CR DG CHEST 2V
2 series · 2 of 2 positions shown · non-contrast
Comparison: Chest x-ray of June 23, 2014

CLINICAL DATA: Follow-up on left lung abscess, 2 days of cough and
mild shortness of breath.

EXAM:
CHEST  2 VIEW

[view not recorded (1 of 2)]
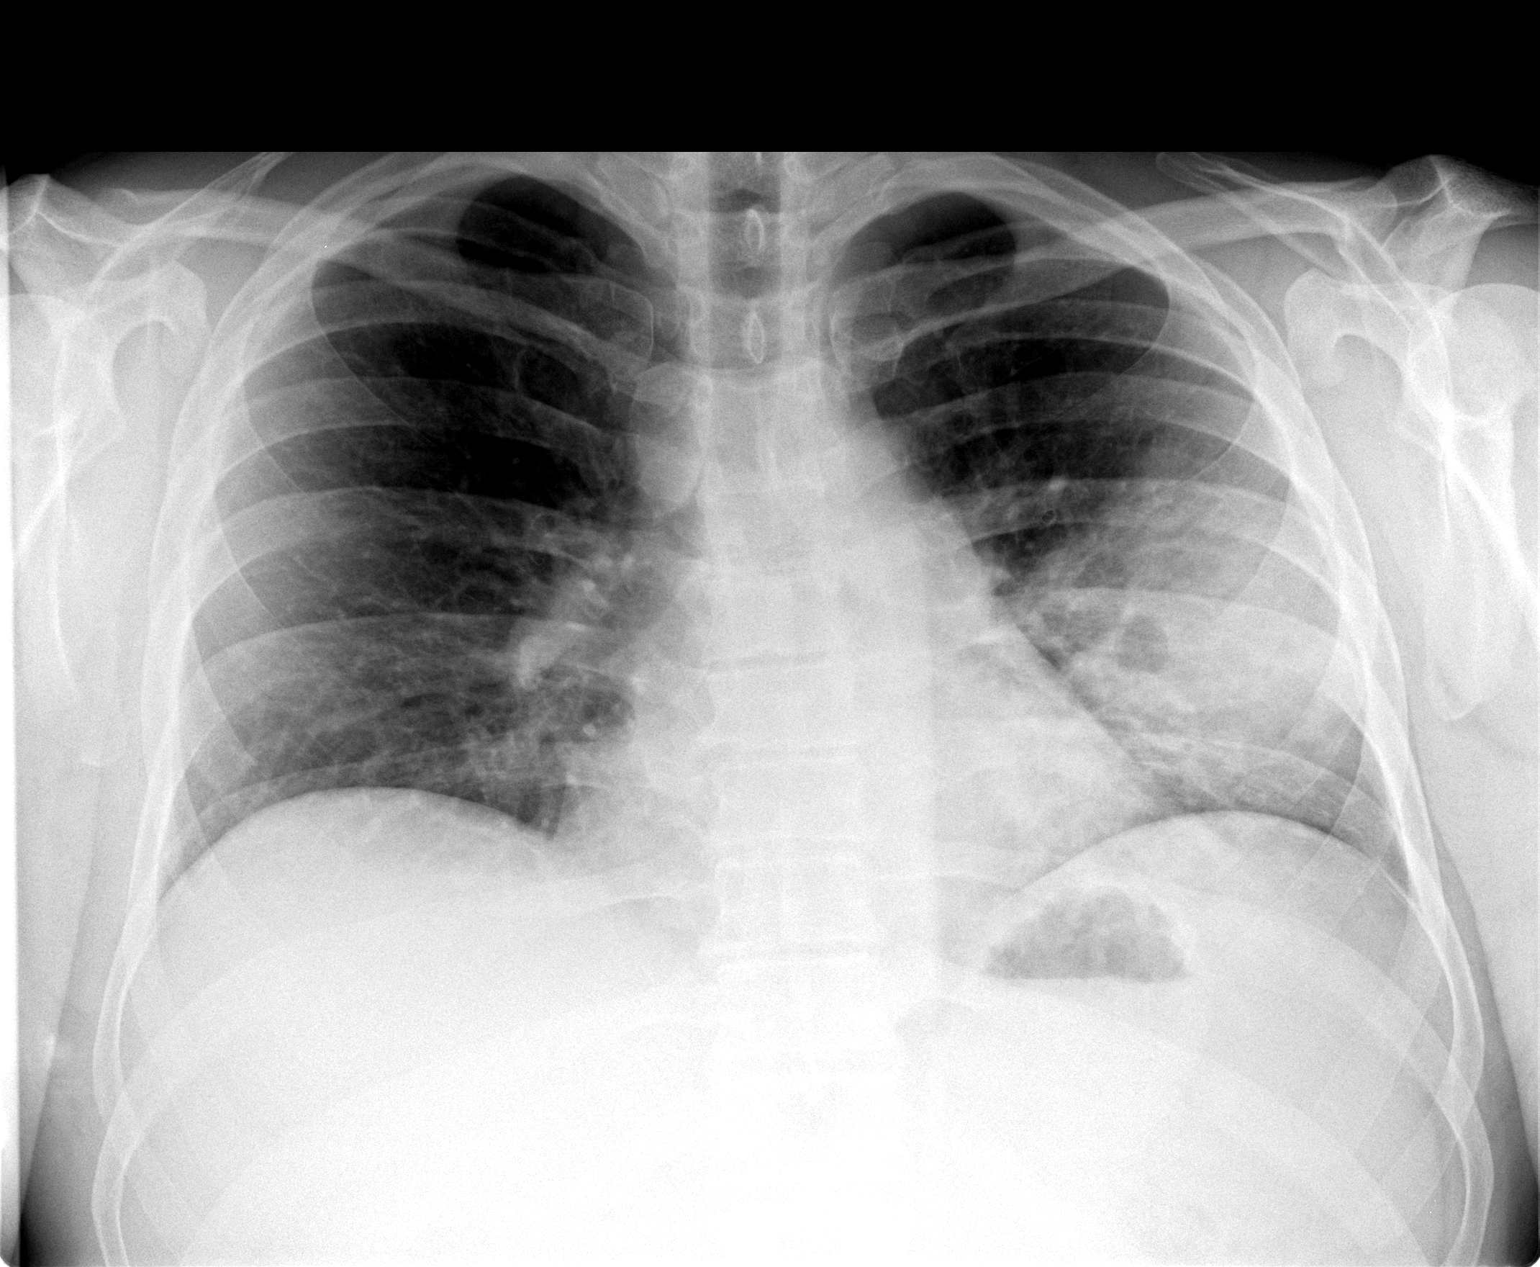

[view not recorded (2 of 2)]
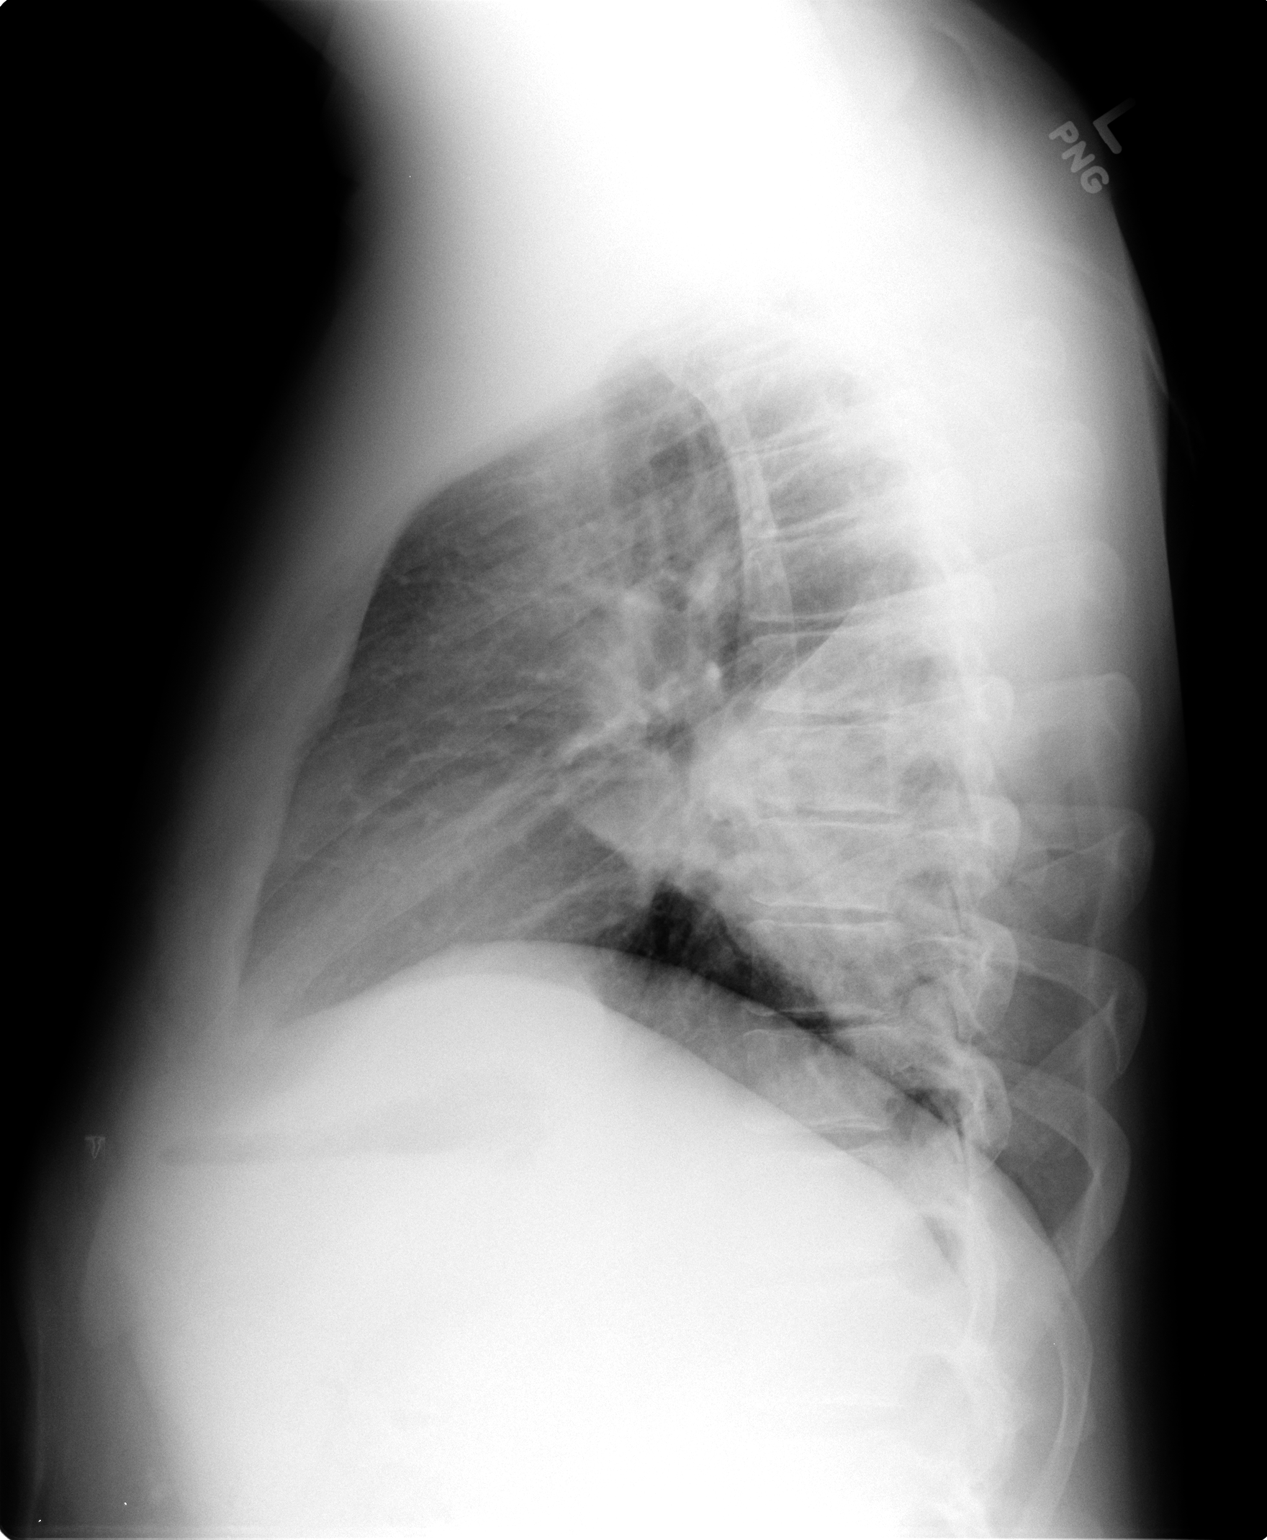

[2 of 2 positions shown; findings below may reference images not displayed]

FINDINGS: There remains dense infiltrate in the superior portion of the left
lower lobe. A 1.4 x 1.6 cm cavitary lesion is present and stable.
The overall involvement of the lower lobe has not significantly
changed. No pleural effusion is demonstrated. The left upper lobe in
the right lung are clear. The heart and mediastinal structures are
normal.
IMPRESSION: There is persistent dense infiltrate in the superior aspect of the
left lower lobe consistent with known lung abscess. If the patient's
clinical condition is deteriorating, chest CT scanning now is
recommended.

## 2016-12-07 DIAGNOSIS — F419 Anxiety disorder, unspecified: Secondary | ICD-10-CM | POA: Diagnosis not present

## 2016-12-07 DIAGNOSIS — R062 Wheezing: Secondary | ICD-10-CM

## 2016-12-07 DIAGNOSIS — I10 Essential (primary) hypertension: Secondary | ICD-10-CM | POA: Diagnosis not present

## 2016-12-07 DIAGNOSIS — Z85118 Personal history of other malignant neoplasm of bronchus and lung: Secondary | ICD-10-CM | POA: Diagnosis not present

## 2017-01-17 IMAGING — CR DG CHEST 1V PORT
1 series · 1 of 1 positions shown · non-contrast
Comparison: 12/05/2014.

CLINICAL DATA: Lung cancer.

EXAM:
PORTABLE CHEST - 1 VIEW

[AP]
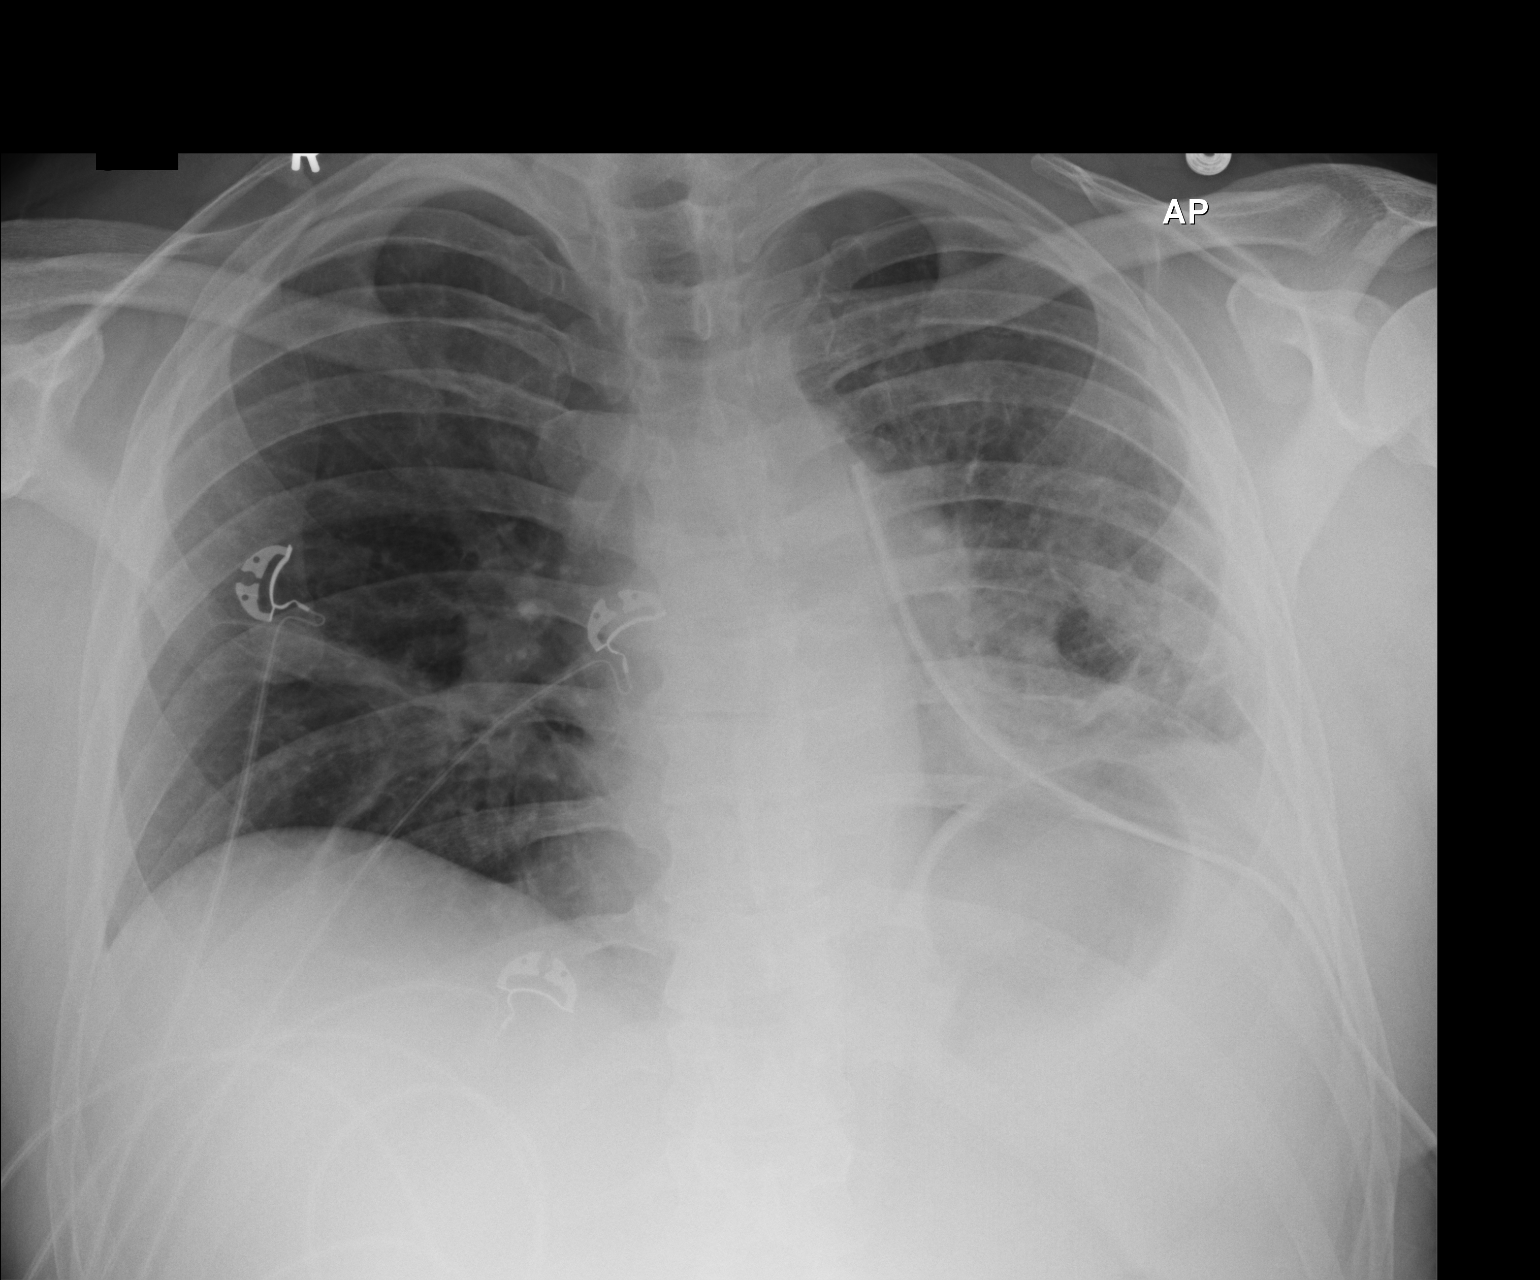

[1 of 1 positions shown; findings below may reference images not displayed]

FINDINGS: Left chest tube in stable position. Stable left apical small
pneumothorax. Mediastinum hilar structures are stable. Heart size
stable. Postsurgical changes left lung again noted. Basilar
atelectasis. Small left pleural effusion cannot be excluded. Right
mid lung subsegmental atelectasis.
IMPRESSION: 1. Left chest tube in stable position. Stable tiny left apical
pneumothorax.

2. Persistent postsurgical changes of the left lung with left base
subsegmental atelectasis and or infiltrate and small left pleural
effusion.

3.  Stable right mid lung subsegmental atelectasis

## 2017-01-18 IMAGING — CR DG CHEST 1V SAME DAY
1 series · 1 of 1 positions shown · non-contrast
Comparison: 12/07/2014

CLINICAL DATA: Interval removal of left chest tube.

EXAM:
CHEST - 1 VIEW SAME DAY

[AP]
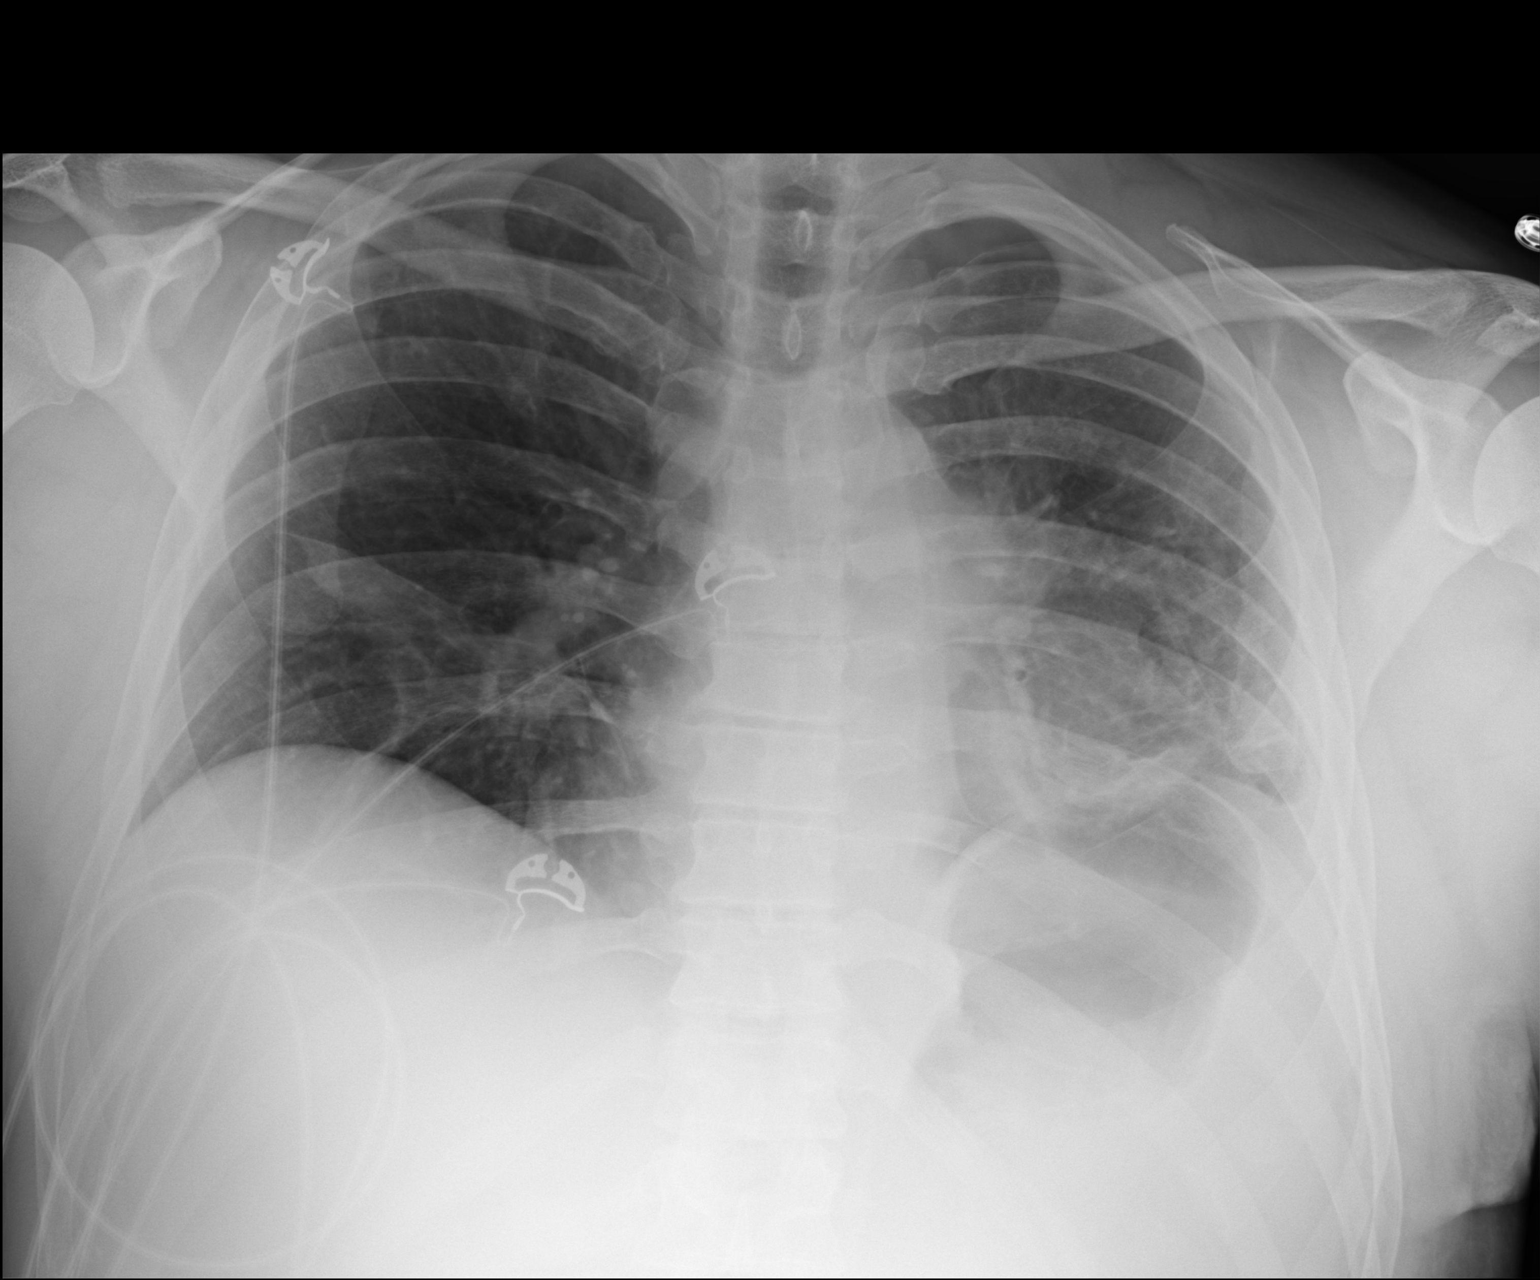

[1 of 1 positions shown; findings below may reference images not displayed]

FINDINGS: The left chest tube has been removed. There is atelectasis noted in
the left base and asymmetric elevation of the left hemidiaphragm.
Small left apical pneumothorax persists and is not significantly
changed from previous exam. There is a small amount of fluid along
the minor fissure of the right lung.
IMPRESSION: 1. Interval removal of left chest tube.
2. Persistent small left apical pneumothorax.
3. Left base atelectasis.

## 2018-01-15 DIAGNOSIS — Z85118 Personal history of other malignant neoplasm of bronchus and lung: Secondary | ICD-10-CM | POA: Diagnosis not present

## 2018-01-15 DIAGNOSIS — R05 Cough: Secondary | ICD-10-CM

## 2018-08-05 DIAGNOSIS — Z85118 Personal history of other malignant neoplasm of bronchus and lung: Secondary | ICD-10-CM | POA: Diagnosis not present

## 2019-02-06 DIAGNOSIS — C3432 Malignant neoplasm of lower lobe, left bronchus or lung: Secondary | ICD-10-CM | POA: Diagnosis not present

## 2019-12-28 ENCOUNTER — Telehealth: Payer: Self-pay | Admitting: Nurse Practitioner

## 2019-12-28 NOTE — Telephone Encounter (Signed)
Called to discuss with Lowella Curb about Covid symptoms and the use of casirivimab/imdevimab, a combination monoclonal antibody infusion for those with mild to moderate Covid symptoms and at a high risk of hospitalization.     Pt is qualified for this infusion at the [facility] infusion center due to co-morbid conditions and/or a member of an at-risk group, however declines infusion at this time. He reports he feels as though he has the flu with aches and congestion however able to function normally.   Patient advised to call back if he decides that he does want to get infusion. Callback number to the infusion center given. Patient advised to go to Urgent care or ED with severe symptoms. Last date he would be eligible for infusion is 01/01/20.    Patient Active Problem List   Diagnosis Date Noted  . Adenocarcinoma of lung, stage 2 ([place]) 01/04/2015  . S/P lobectomy of lung 12/01/2014  . Asthma, chronic 06/27/2014  . Cough 06/23/2014    Alda Lea, AGPCNP-BC Pager: [phone] Amion: Bjorn Pippin

## 2021-03-28 HISTORY — PX: APPENDECTOMY: SHX54

## 2021-04-26 ENCOUNTER — Telehealth: Payer: Self-pay

## 2021-04-26 NOTE — Telephone Encounter (Signed)
Notified office manager Jerolyn Shin.

## 2021-04-26 NOTE — Telephone Encounter (Signed)
-----   Message from [name], MD sent at 04/26/2021  9:49 AM EST ----- Regarding: med/appt Last seen Sept 2021 for stage IIB lung cancer.  I rec'd request for refill of his cough med and refused unless he is seen at least yearly.  Needs appt & labs  This is one of several yearly f/u pts due this fall that never got scheduled when we switched to Epic. Pls inform Anderson Malta although I am not sure what she can do about it.  I don't know if an effort was made to schedule him or not.  Let me know if she needs other examples

## 2021-04-27 ENCOUNTER — Other Ambulatory Visit: Payer: Self-pay | Admitting: Oncology

## 2021-04-27 DIAGNOSIS — C3492 Malignant neoplasm of unspecified part of left bronchus or lung: Secondary | ICD-10-CM

## 2021-04-27 NOTE — Progress Notes (Signed)
[place] [place]  [address] [place],  [place]  [address] [phone]  Clinic Day:  04/28/2021  Referring physician: Melony Overly, MD  This document serves as a record of services personally performed by Hosie Poisson, MD. It was created on their behalf by [facility], a trained medical scribe. The creation of this record is based on the scribe's personal observations and the provider's statements to them.  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  He declined adjuvant chemotherapy but has had no evidence of disease.    COVID back in August 2021 with residual shortness of breath with exertion, sinus drainage/pressure and short term memory loss. He has now returned to baseline.  Recent appendicitis, status post appendectomy October 2022.  Hepatic steatosis, as seen on CT abdomen and pelvis from October 2022. The SGPT is mildly elevated and we will continue to monitor.  I will add a vitamin B12 and D level today as requested, and will call him with the results as well as his CEA. I will refill his cough medicine today. As it has been over 1 year since his last CT chest, we will schedule this now. Otherwise, I will see him back in 1 year with CBC, comprehensive metabolic profile, CEA, and CT thorax.  He understands and agrees with this plan of care.   I provided 20 minutes of face-to-face time during this this encounter and > 50% was spent counseling as documented under my assessment and plan.    [name], MD [place] [address] [place] [address] Dept: [phone] Dept Fax: [phone]    CHIEF COMPLAINT:  CC: History of stage IIB adenocarcinoma of the left lower lobe  Current Treatment:  Surveillance   HISTORY OF PRESENT ILLNESS:  Andrew Roth is a 50 y.o. male with  a history of stage IIB (T3 N0 M0) adenocarcinoma of the left lower lobe diagnosed in May 2016.  We began seeing him in May 2017, when he transferred his care here.  He was treated with surgical resection in July 2016.  Pathology revealed a 7.2 cm well-differentiated adenocarcinoma with 8 lymph nodes negative for metastasis.  We do not have the results of any genomic testing done on the tumor.  Adjuvant chemotherapy was recommended, but he declined.  Postoperative PET scan in August 2016 did not reveal any evidence of malignancy.  CT chest in November 2017 revealed postsurgical changes in the left lower lobe, but no evidence of malignancy.  CT chest in July 2018 did not reveal any evidence of malignancy.  The patient has never smoked.  He had Prevnar 13 and Pneumovax 23 in 2018. His father was diagnosed with colon cancer at age 67 and a history of prostate cancer.  He did have COVID in August 2021.   INTERVAL HISTORY:  I have reviewed his chart and materials related to his cancer extensively and collaborated history with the patient. Summary of oncologic history is as follows: Oncology History   No history exists.    Andrew Roth is here for annual follow up and states that he has been fairly well. His cough has improved, but he still has occasional spells. I will send in a refill of his cough medicine today. He recently had appendicitis in October and required appendectomy. He is now back in the gym. He was receiving B12  injections, but has not had one for a long time. Blood counts and chemistries are unremarkable except for an SGPT of 52. CT of the abdomen and pelvis from October did reveal hepatic steatosis. His  appetite is good, and he has gained 12 pounds since his last visit over a year ago.  He denies fever, chills or other signs of infection.  He denies nausea, vomiting, bowel issues, or abdominal pain.  He denies sore throat, dyspnea, or chest pain.  HISTORY:   Allergies:  Allergies  Allergen  Reactions   Naproxen     Put him in the hospital Other reaction(s): Other (See Comments) Put him in the hospital, gallbladder spasms  Put him in the hospital    Ciprofloxacin Other (See Comments)    Stomach cramps  Other reaction(s): Other (See Comments) Stomach cramps Stomach cramps    Clarithromycin     Current Medications: Current Outpatient Medications  Medication Sig Dispense Refill   acetaminophen (TYLENOL) 325 MG tablet Take 650 mg by mouth every 6 (six) hours as needed for moderate pain.      amLODipine (NORVASC) 10 MG tablet Take 10 mg by mouth daily.     ascorbic acid (VITAMIN C) 1000 MG tablet Take by mouth.     DULOXETINE HCL PO Take 90 mg by mouth daily.     NON FORMULARY Takes a vitamin injection every other week     Omega-3 Fatty Acids (FISH OIL) 1200 MG CAPS Take 1,200 mg by mouth daily.     Multiple Vitamin (MULTI-VITAMIN) tablet Take 1 tablet by mouth daily.     No current facility-administered medications for this visit.    REVIEW OF SYSTEMS:  Review of Systems  Constitutional:  Positive for fatigue. Negative for appetite change, chills, fever and unexpected weight change.  HENT:  Negative.    Eyes: Negative.   Respiratory:  Positive for cough (improved). Negative for chest tightness, hemoptysis, shortness of breath and wheezing.   Cardiovascular: Negative.  Negative for chest pain, leg swelling and palpitations.  Gastrointestinal: Negative.  Negative for abdominal distention, abdominal pain, blood in stool, constipation, diarrhea, nausea and vomiting.  Endocrine: Negative.   Genitourinary: Negative.  Negative for difficulty urinating, dysuria, frequency and hematuria.   Musculoskeletal: Negative.  Negative for arthralgias, back pain, flank pain, gait problem and myalgias.  Skin: Negative.   Neurological: Negative.  Negative for dizziness, extremity weakness, gait problem, headaches, light-headedness, numbness, seizures and speech difficulty.   Hematological: Negative.   Psychiatric/Behavioral:  Negative for depression and sleep disturbance. The patient is not nervous/anxious.   All other systems reviewed and are negative.    VITALS:  Blood pressure (!) 157/103, pulse (!) 112, temperature 98.8 F (37.1 C), temperature source Oral, resp. rate 16, height 5\' 10"  (1.778 m), weight 224 lb 3.2 oz (101.7 kg), SpO2 97 %.  Wt Readings from Last 3 Encounters:  04/28/21 224 lb 3.2 oz (101.7 kg)  03/08/15 215 lb (97.5 kg)  01/04/15 208 lb (94.3 kg)    Body mass index is 32.17 kg/m.  Performance status (ECOG): 1 - Symptomatic but completely ambulatory  PHYSICAL EXAM:  Physical Exam Constitutional:      General: He is not in acute distress.    Appearance: Normal appearance. He is normal weight.  HENT:     Head: Normocephalic and atraumatic.  Eyes:     General: No scleral icterus.    Extraocular Movements: Extraocular movements intact.     Conjunctiva/sclera: Conjunctivae normal.  Pupils: Pupils are equal, round, and reactive to light.  Cardiovascular:     Rate and Rhythm: Regular rhythm. Tachycardia present.     Pulses: Normal pulses.     Heart sounds: Normal heart sounds. No murmur heard.   No friction rub. No gallop.  Pulmonary:     Effort: Pulmonary effort is normal. No respiratory distress.     Breath sounds: Normal breath sounds.  Abdominal:     General: Bowel sounds are normal. There is no distension.     Palpations: Abdomen is soft. There is no hepatomegaly, splenomegaly or mass.     Tenderness: There is no abdominal tenderness.     Comments: Small incisions in the midline lower abdomen which are healing very well.  Musculoskeletal:        General: Normal range of motion.     Cervical back: Normal range of motion and neck supple.     Right lower leg: No edema.     Left lower leg: No edema.  Lymphadenopathy:     Cervical: No cervical adenopathy.  Skin:    General: Skin is warm and dry.  Neurological:      General: No focal deficit present.     Mental Status: He is alert and oriented to person, place, and time. Mental status is at baseline.  Psychiatric:        Mood and Affect: Mood normal.        Behavior: Behavior normal.        Thought Content: Thought content normal.        Judgment: Judgment normal.    LABS:   CBC Latest Ref Rng & Units 04/28/2021 12/03/2014 12/02/2014  WBC - 5.8 8.8 10.1  Hemoglobin 13.5 - 17.5 15.6 12.5(L) 13.7  Hematocrit 41 - 53 45 36.8(L) 38.6(L)  Platelets 150 - 399 290 225 236   CMP Latest Ref Rng & Units 04/28/2021 12/03/2014 12/02/2014  Glucose 65 - 99 mg/dL - 114(H) 132(H)  BUN 4 - 21 16 8 8   Creatinine 0.6 - 1.3 0.9 1.00 0.80  Sodium 137 - 147 138 136 135  Potassium 3.4 - 5.3 3.9 3.8 3.8  Chloride 99 - 108 105 103 104  CO2 13 - 22 24(A) 27 26  Calcium 8.7 - 10.7 9.1 7.1(L) 7.9(L)  Total Protein 6.5 - 8.1 g/dL - 5.2(L) -  Total Bilirubin 0.3 - 1.2 mg/dL - 0.8 -  Alkaline Phos 25 - 125 62 42 -  AST 14 - 40 41(A) 17 -  ALT 10 - 40 52(A) 19 -     No results found for: CEA1 / No results found for: CEA1   STUDIES:  No results found.    I, Rita Ohara, am acting as scribe for [name], MD  I have reviewed this report as typed by the medical scribe, and it is complete and accurate.

## 2021-04-28 ENCOUNTER — Other Ambulatory Visit: Payer: Self-pay | Admitting: Hematology and Oncology

## 2021-04-28 ENCOUNTER — Inpatient Hospital Stay: Payer: Commercial Managed Care - PPO | Attending: Oncology | Admitting: Oncology

## 2021-04-28 ENCOUNTER — Inpatient Hospital Stay: Payer: Commercial Managed Care - PPO

## 2021-04-28 ENCOUNTER — Other Ambulatory Visit: Payer: Self-pay | Admitting: Oncology

## 2021-04-28 ENCOUNTER — Encounter: Payer: Self-pay | Admitting: Oncology

## 2021-04-28 VITALS — BP 157/103 | HR 112 | Temp 98.8°F | Resp 16 | Ht 70.0 in | Wt 224.2 lb

## 2021-04-28 DIAGNOSIS — C3492 Malignant neoplasm of unspecified part of left bronchus or lung: Secondary | ICD-10-CM

## 2021-04-28 DIAGNOSIS — C3432 Malignant neoplasm of lower lobe, left bronchus or lung: Secondary | ICD-10-CM | POA: Insufficient documentation

## 2021-04-28 LAB — COMPREHENSIVE METABOLIC PANEL
Albumin: 4.5 (ref 3.5–5.0)
Calcium: 9.1 (ref 8.7–10.7)

## 2021-04-28 LAB — HEPATIC FUNCTION PANEL
ALT: 52 — AB (ref 10–40)
AST: 41 — AB (ref 14–40)
Alkaline Phosphatase: 62 (ref 25–125)
Bilirubin, Total: 0.8

## 2021-04-28 LAB — BASIC METABOLIC PANEL
BUN: 16 (ref 4–21)
CO2: 24 — AB (ref 13–22)
Chloride: 105 (ref 99–108)
Creatinine: 0.9 (ref 0.6–1.3)
Glucose: 147
Potassium: 3.9 (ref 3.4–5.3)
Sodium: 138 (ref 137–147)

## 2021-04-28 LAB — CBC AND DIFFERENTIAL
HCT: 45 (ref 41–53)
Hemoglobin: 15.6 (ref 13.5–17.5)
Neutrophils Absolute: 2.9
Platelets: 290 (ref 150–399)
WBC: 5.8

## 2021-04-28 LAB — CBC: RBC: 5.11 (ref 3.87–5.11)

## 2021-04-28 LAB — VITAMIN B12: Vitamin B-12: 887 pg/mL (ref 180–914)

## 2021-04-28 LAB — VITAMIN D 25 HYDROXY (VIT D DEFICIENCY, FRACTURES): Vit D, 25-Hydroxy: 40.16 ng/mL (ref 30–100)

## 2021-04-29 LAB — CEA: CEA: 1.2 ng/mL (ref 0.0–4.7)

## 2021-05-03 ENCOUNTER — Other Ambulatory Visit: Payer: Self-pay | Admitting: Oncology

## 2021-05-03 ENCOUNTER — Telehealth: Payer: Self-pay | Admitting: Oncology

## 2021-05-03 ENCOUNTER — Telehealth: Payer: Self-pay

## 2021-05-03 DIAGNOSIS — C3432 Malignant neoplasm of lower lobe, left bronchus or lung: Secondary | ICD-10-CM

## 2021-05-03 MED ORDER — HYDROCODONE BIT-HOMATROP MBR 5-1.5 MG/5ML PO SOLN: 5.0000 mL | Freq: Four times a day (QID) | ORAL | 0 refills | Status: DC | PRN

## 2021-05-03 NOTE — Telephone Encounter (Signed)
Per 12/2 LOS, patient scheduled for 12/8 CT Chest checking in at [facility] 12:45 pm.  Patient notified of Appt/Instructions

## 2021-05-03 NOTE — Telephone Encounter (Signed)
Dr. Hinton Rao said she would order cough medication.

## 2021-05-08 ENCOUNTER — Telehealth: Payer: Self-pay | Admitting: Oncology

## 2021-05-08 NOTE — Telephone Encounter (Signed)
Patient rescheduled to 12/14 checking in at [facility] 12:45 pm - Gave Instructions

## 2021-05-10 ENCOUNTER — Telehealth: Payer: Self-pay | Admitting: Oncology

## 2021-05-10 NOTE — Telephone Encounter (Signed)
Per 12/2 LOS, patient scheduled for 12/8, then rescheduled to 12/14.  Per 12/12 LOS patient scheduled for 04/27/2022 Follow Up Appt.

## 2021-05-15 ENCOUNTER — Telehealth: Payer: Self-pay

## 2021-05-15 NOTE — Telephone Encounter (Signed)
Patient notified

## 2021-05-15 NOTE — Telephone Encounter (Signed)
Patient notified and sent to scheduling

## 2021-05-15 NOTE — Telephone Encounter (Signed)
-----   Message from [name], MD sent at 05/05/2021  7:05 PM EST ----- Regarding: call Tell him B12 great at 887, vitamin D good at 40, CEA normal at 1.2.  He needs to resched CT

## 2021-05-15 NOTE — Telephone Encounter (Signed)
-----   Message from [name], MD sent at 05/15/2021 10:49 AM EST ----- Regarding: call Tell him CT looks okay.  We still see scar tissue but overall not susp for cancer.  They rec repeat in 6 mnths to f/u on some of the hazy changes.  If he agrees, I can schedule in 6 months with appt with me 2 days later to go over the results in person.

## 2021-05-16 ENCOUNTER — Encounter: Payer: Self-pay | Admitting: Oncology

## 2021-06-28 ENCOUNTER — Ambulatory Visit: Payer: Commercial Managed Care - PPO | Admitting: Physician Assistant

## 2021-06-28 ENCOUNTER — Other Ambulatory Visit: Payer: Self-pay

## 2021-06-28 ENCOUNTER — Encounter: Payer: Self-pay | Admitting: Physician Assistant

## 2021-06-28 VITALS — BP 140/82 | HR 92 | Temp 97.4°F | Ht 70.0 in | Wt 228.4 lb

## 2021-06-28 DIAGNOSIS — F419 Anxiety disorder, unspecified: Secondary | ICD-10-CM

## 2021-06-28 DIAGNOSIS — I10 Essential (primary) hypertension: Secondary | ICD-10-CM

## 2021-06-28 DIAGNOSIS — C3492 Malignant neoplasm of unspecified part of left bronchus or lung: Secondary | ICD-10-CM

## 2021-06-28 DIAGNOSIS — R5383 Other fatigue: Secondary | ICD-10-CM

## 2021-06-28 MED ORDER — AMLODIPINE BESYLATE 10 MG PO TABS: 10.0000 mg | ORAL_TABLET | Freq: Every day | ORAL | 1 refills | Status: DC

## 2021-06-28 MED ORDER — DULOXETINE HCL 30 MG PO CPEP: 30.0000 mg | ORAL_CAPSULE | Freq: Every day | ORAL | 1 refills | Status: DC

## 2021-06-28 MED ORDER — DULOXETINE HCL 60 MG PO CPEP: 60.0000 mg | ORAL_CAPSULE | Freq: Every day | ORAL | 1 refills | Status: DC

## 2021-06-28 NOTE — Progress Notes (Signed)
New Patient Office Visit  Subjective:  Patient ID: Andrew Roth, male    DOB: 1970-08-12  Age: 51 y.o. MRN: [id number]  CC:  Chief Complaint  Patient presents with   Establish Care   Hypertension    HPI Andrew Roth presents for hypertension -pt states that he has been on norvasc 10mg  qd for several years - has [name] per pt but states at home bp runs fine Denies chest pain or dyspnea  Pt with history of lung adenocarcinoma - he had left lower lobe resection in 2016 - he currently follows [name] yearly - he states he was advised to do chemotherapy but chose not to have that done He states he has an alkaline water system in his home as well as receiving vitamin IV Therapy every other week at a clinic in [place]  Pt states that he has history of fatigue and began taking anabolic steroids in October 2022 Pt is told this is not advised - at risk for cardiac/hepatic and other complications  Pt with history of anxiety and currently taking cymbalta 90mg  qd - states this medication works well for him and requests refill  Past Medical History:  Diagnosis Date   Anxiety    takes Cymbalta daily   Arthritis    Asthma    Symbicort inhaler   Cough    productive   Depression    Diabetes mellitus without complication (HCC)    borderline   GERD (gastroesophageal reflux disease)    occasionally but no issues in yrs    History of bronchitis    month and a half ago   History of gout    Joint pain    Lung cancer ([place])    Pneumonia    continuously for 34yrs    PONV (postoperative nausea and vomiting)    Shortness of breath dyspnea    occasionally and with exertion    Past Surgical History:  Procedure Laterality Date   APPENDECTOMY  03/2021   BACK SURGERY  05/29/1999   BACK SURGERY     CRYO INTERCOSTAL NERVE BLOCK N/A 12/01/2014   Procedure: CRYO INTERCOSTAL NERVE BLOCK;  Surgeon: [name], MD;  Location: [facility] OR;  Service: Thoracic;   Laterality: N/A;   HERNIA REPAIR Bilateral    JOINT REPLACEMENT Left    knee   KNEE ARTHROSCOPY Left    x 3   VIDEO ASSISTED THORACOSCOPY (VATS)/ LOBECTOMY Left 12/01/2014   Procedure: VIDEO ASSISTED THORACOSCOPY (VATS)/ LOBECTOMY;  Surgeon: [name], MD;  Location: [facility];  Service: Thoracic;  Laterality: Left;   VIDEO BRONCHOSCOPY Bilateral 08/16/2014   Procedure: VIDEO BRONCHOSCOPY WITHOUT FLUORO;  Surgeon: [name], MD;  Location: [facility];  Service: Endoscopy;  Laterality: Bilateral;   VIDEO BRONCHOSCOPY N/A 10/01/2014   Procedure: VIDEO BRONCHOSCOPY;  Surgeon: [name], MD;  Location: [facility] OR;  Service: Thoracic;  Laterality: N/A;   wisdom teeth extracted      Family History  Problem Relation Age of Onset   Rheum arthritis Mother    Diabetes Mother    Prostate cancer Father    Hypertension Mother    Cancer Father    Heart disease Paternal Grandmother    Heart disease Paternal Grandfather     Social History   Socioeconomic History   Marital status: Married    Spouse name: [name]   Number of children: 5   Years of education: Not on file   Highest  education level: Not on file  Occupational History   Occupation: self employed  Tobacco Use   Smoking status: Never   Smokeless tobacco: Never  Vaping Use   Vaping Use: Some days  Substance and Sexual Activity   Alcohol use: Yes    Alcohol/week: 0.0 standard drinks    Comment: rarely   Drug use: No   Sexual activity: Yes  Other Topics Concern   Not on file  Social History Narrative   Not on file   Social Determinants of Health   Financial Resource Strain: Not on file  Food Insecurity: Not on file  Transportation Needs: Not on file  Physical Activity: Not on file  Stress: Not on file  Social Connections: Not on file  Intimate Partner Violence: Not on file     Current Outpatient Medications:    ascorbic acid (VITAMIN C) 1000 MG tablet, Take by mouth., Disp: , Rfl:     HYDROcodone bit-homatropine (HYCODAN) 5-1.5 MG/5ML syrup, Take 5 mLs by mouth every 6 (six) hours as needed for cough., Disp: 120 mL, Rfl: 0   milk thistle 175 MG tablet, Take 175 mg by mouth daily., Disp: , Rfl:    Multiple Vitamin (MULTI-VITAMIN) tablet, Take 1 tablet by mouth daily., Disp: , Rfl:    NON FORMULARY, Takes a vitamin injection every other week, Disp: , Rfl:    Omega-3 Fatty Acids (FISH OIL) 1200 MG CAPS, Take 1,200 mg by mouth daily., Disp: , Rfl:    amLODipine (NORVASC) 10 MG tablet, Take 1 tablet (10 mg total) by mouth daily., Disp: 90 tablet, Rfl: 1   DULoxetine (CYMBALTA) 30 MG capsule, Take 1 capsule (30 mg total) by mouth daily., Disp: 90 capsule, Rfl: 1   DULoxetine (CYMBALTA) 60 MG capsule, Take 1 capsule (60 mg total) by mouth daily., Disp: 90 capsule, Rfl: 1   Allergies  Allergen Reactions   Naproxen     Put him in the hospital Other reaction(s): Other (See Comments) Put him in the hospital, gallbladder spasms  Put him in the hospital    Ciprofloxacin Other (See Comments)    Stomach cramps  Other reaction(s): Other (See Comments) Stomach cramps Stomach cramps    Clarithromycin     ROS CONSTITUTIONAL: see HPI E/N/T: Negative for ear pain, nasal congestion and sore throat.  CARDIOVASCULAR: Negative for chest pain, dizziness, palpitations and pedal edema.  RESPIRATORY: Negative for recent cough and dyspnea.  GASTROINTESTINAL: Negative for abdominal pain, acid reflux symptoms, constipation, diarrhea, nausea and vomiting.  MSK: Negative for arthralgias and myalgias.  PSYCHIATRIC: see HPI       Objective:    PHYSICAL EXAM:   VS: BP 140/82    Pulse 92    Temp (!) 97.4 F (36.3 C)    Ht 5\' 10"  (1.778 m)    Wt 228 lb 6.4 oz (103.6 kg)    SpO2 97%    BMI 32.77 kg/m   GEN: Well nourished, well developed, in no acute distress  Cardiac: RRR; no murmurs, rubs, or gallops,no edema - Respiratory:  normal respiratory rate and pattern with no distress - normal  breath sounds with no rales, rhonchi, wheezes or rubs GI: normal bowel sounds, no masses or tenderness MS: no deformity or atrophy  Skin: warm and dry, no rash  Psych: euthymic mood, appropriate affect and demeanor  BP 140/82    Pulse 92    Temp (!) 97.4 F (36.3 C)    Ht 5\' 10"  (1.778 m)  Wt 228 lb 6.4 oz (103.6 kg)    SpO2 97%    BMI 32.77 kg/m  Wt Readings from Last 3 Encounters:  06/28/21 228 lb 6.4 oz (103.6 kg)  04/28/21 224 lb 3.2 oz (101.7 kg)  03/08/15 215 lb (97.5 kg)     There are no preventive care reminders to display for this patient.  There are no preventive care reminders to display for this patient.  No results found for: TSH Lab Results  Component Value Date   WBC 5.8 04/28/2021   HGB 15.6 04/28/2021   HCT 45 04/28/2021   MCV 88.2 12/03/2014   PLT 290 04/28/2021   Lab Results  Component Value Date   NA 138 04/28/2021   K 3.9 04/28/2021   CO2 24 (A) 04/28/2021   GLUCOSE 114 (H) 12/03/2014   BUN 16 04/28/2021   CREATININE 0.9 04/28/2021   BILITOT 0.8 12/03/2014   ALKPHOS 62 04/28/2021   AST 41 (A) 04/28/2021   ALT 52 (A) 04/28/2021   PROT 5.2 (L) 12/03/2014   ALBUMIN 4.5 04/28/2021   CALCIUM 9.1 04/28/2021   ANIONGAP 6 12/03/2014   No results found for: CHOL No results found for: HDL No results found for: LDLCALC No results found for: TRIG No results found for: CHOLHDL Lab Results  Component Value Date   HGBA1C 6.0 (H) 11/30/2014      Assessment & Plan:   Problem List Items Addressed This Visit   None Visit Diagnoses     Benign hypertension    -  Primary   Relevant Orders   CBC with Differential/Platelet   Comprehensive metabolic panel   Lipid panel Continue current meds   Other fatigue       Relevant Orders   CBC with Differential/Platelet   Comprehensive metabolic panel   Testosterone,Free and Total   TSH  ANXIETY Continue current meds as directed       Meds ordered this encounter  Medications   amLODipine  (NORVASC) 10 MG tablet    Sig: Take 1 tablet (10 mg total) by mouth daily.    Dispense:  90 tablet    Refill:  1    Order Specific Question:   Supervising Provider    Answer:   Shelton Silvas   DULoxetine (CYMBALTA) 30 MG capsule    Sig: Take 1 capsule (30 mg total) by mouth daily.    Dispense:  90 capsule    Refill:  1    Order Specific Question:   Supervising Provider    Answer:   Shelton Silvas   DULoxetine (CYMBALTA) 60 MG capsule    Sig: Take 1 capsule (60 mg total) by mouth daily.    Dispense:  90 capsule    Refill:  1    Order Specific Question:   Supervising Provider    Answer:   Shelton Silvas    Follow-up: Return in about 6 months (around 12/26/2021) for chronic fasting follow up.    SARA R Rondel Episcopo, PA-C

## 2021-06-30 ENCOUNTER — Other Ambulatory Visit: Payer: Self-pay | Admitting: Physician Assistant

## 2021-06-30 DIAGNOSIS — R739 Hyperglycemia, unspecified: Secondary | ICD-10-CM

## 2021-06-30 LAB — CBC WITH DIFFERENTIAL/PLATELET
Basophils Absolute: 0.1 10*3/uL (ref 0.0–0.2)
Basos: 1 %
EOS (ABSOLUTE): 0.1 10*3/uL (ref 0.0–0.4)
Eos: 2 %
Hematocrit: 47.8 % (ref 37.5–51.0)
Hemoglobin: 15.9 g/dL (ref 13.0–17.7)
Immature Grans (Abs): 0 10*3/uL (ref 0.0–0.1)
Immature Granulocytes: 0 %
Lymphocytes Absolute: 2.1 10*3/uL (ref 0.7–3.1)
Lymphs: 33 %
MCH: 28.8 pg (ref 26.6–33.0)
MCHC: 33.3 g/dL (ref 31.5–35.7)
MCV: 86 fL (ref 79–97)
Monocytes Absolute: 0.6 10*3/uL (ref 0.1–0.9)
Monocytes: 9 %
Neutrophils Absolute: 3.5 10*3/uL (ref 1.4–7.0)
Neutrophils: 55 %
Platelets: 375 10*3/uL (ref 150–450)
RBC: 5.53 x10E6/uL (ref 4.14–5.80)
RDW: 11.7 % (ref 11.6–15.4)
WBC: 6.5 10*3/uL (ref 3.4–10.8)

## 2021-06-30 LAB — LIPID PANEL
Chol/HDL Ratio: 5.8 ratio — ABNORMAL HIGH (ref 0.0–5.0)
Cholesterol, Total: 190 mg/dL (ref 100–199)
HDL: 33 mg/dL — ABNORMAL LOW (ref >39)
LDL Chol Calc (NIH): 127 mg/dL — ABNORMAL HIGH (ref 0–99)
Triglycerides: 167 mg/dL — ABNORMAL HIGH (ref 0–149)
VLDL Cholesterol Cal: 30 mg/dL (ref 5–40)

## 2021-06-30 LAB — COMPREHENSIVE METABOLIC PANEL
ALT: 42 IU/L (ref 0–44)
AST: 29 IU/L (ref 0–40)
Albumin/Globulin Ratio: 2.1 (ref 1.2–2.2)
Albumin: 4.6 g/dL (ref 4.0–5.0)
Alkaline Phosphatase: 69 IU/L (ref 44–121)
BUN/Creatinine Ratio: 10 (ref 9–20)
BUN: 12 mg/dL (ref 6–24)
Bilirubin Total: 0.5 mg/dL (ref 0.0–1.2)
CO2: 23 mmol/L (ref 20–29)
Calcium: 9.3 mg/dL (ref 8.7–10.2)
Chloride: 97 mmol/L (ref 96–106)
Creatinine, Ser: 1.19 mg/dL (ref 0.76–1.27)
Globulin, Total: 2.2 g/dL (ref 1.5–4.5)
Glucose: 174 mg/dL — ABNORMAL HIGH (ref 70–99)
Potassium: 4.7 mmol/L (ref 3.5–5.2)
Sodium: 137 mmol/L (ref 134–144)
Total Protein: 6.8 g/dL (ref 6.0–8.5)
eGFR: 75 mL/min/{1.73_m2} (ref >59)

## 2021-06-30 LAB — TESTOSTERONE,FREE AND TOTAL
Testosterone, Free: 22 pg/mL — ABNORMAL HIGH (ref 6.8–21.5)
Testosterone: 847 ng/dL (ref 264–916)

## 2021-06-30 LAB — TSH: TSH: 0.893 u[IU]/mL (ref 0.450–4.500)

## 2021-06-30 LAB — CARDIOVASCULAR RISK ASSESSMENT

## 2021-07-04 LAB — SPECIMEN STATUS REPORT

## 2021-07-04 LAB — HGB A1C W/O EAG: Hgb A1c MFr Bld: 6.5 % — ABNORMAL HIGH (ref 4.8–5.6)

## 2021-11-08 ENCOUNTER — Other Ambulatory Visit: Payer: Self-pay | Admitting: Hematology and Oncology

## 2021-11-08 DIAGNOSIS — C3432 Malignant neoplasm of lower lobe, left bronchus or lung: Secondary | ICD-10-CM

## 2021-11-08 MED ORDER — HYDROCODONE BIT-HOMATROP MBR 5-1.5 MG/5ML PO SOLN: 5.0000 mL | Freq: Four times a day (QID) | ORAL | 0 refills | Status: DC | PRN

## 2022-01-04 ENCOUNTER — Ambulatory Visit: Payer: Commercial Managed Care - PPO | Admitting: Physician Assistant

## 2022-01-04 ENCOUNTER — Encounter: Payer: Self-pay | Admitting: Physician Assistant

## 2022-01-04 VITALS — BP 128/82 | HR 91 | Temp 97.5°F | Ht 70.0 in | Wt 219.4 lb

## 2022-01-04 DIAGNOSIS — I10 Essential (primary) hypertension: Secondary | ICD-10-CM | POA: Diagnosis not present

## 2022-01-04 DIAGNOSIS — R5383 Other fatigue: Secondary | ICD-10-CM | POA: Diagnosis not present

## 2022-01-04 DIAGNOSIS — R202 Paresthesia of skin: Secondary | ICD-10-CM

## 2022-01-04 DIAGNOSIS — R739 Hyperglycemia, unspecified: Secondary | ICD-10-CM

## 2022-01-04 DIAGNOSIS — C3492 Malignant neoplasm of unspecified part of left bronchus or lung: Secondary | ICD-10-CM

## 2022-01-04 DIAGNOSIS — Z23 Encounter for immunization: Secondary | ICD-10-CM | POA: Diagnosis not present

## 2022-01-04 DIAGNOSIS — F419 Anxiety disorder, unspecified: Secondary | ICD-10-CM | POA: Diagnosis not present

## 2022-01-04 DIAGNOSIS — E782 Mixed hyperlipidemia: Secondary | ICD-10-CM | POA: Insufficient documentation

## 2022-01-04 DIAGNOSIS — Z92241 Personal history of systemic steroid therapy: Secondary | ICD-10-CM | POA: Insufficient documentation

## 2022-01-04 MED ORDER — PREDNISONE 20 MG PO TABS: ORAL_TABLET | ORAL | 0 refills | Status: AC

## 2022-01-04 NOTE — Progress Notes (Signed)
New Patient Office Visit  Subjective:  Patient ID: Andrew Roth, male    DOB: 02/09/1971  Age: 51 y.o. MRN: [id number]  CC:  Chief Complaint  Patient presents with   Hypertension    HPI Andrew Roth presents for hypertension -pt states that he has been on norvasc 64m qd for several years - is currently taking norvasc 173mqd Denies chest pain or dyspnea  Pt with history of lung adenocarcinoma - he had left lower lobe resection in 2016 - he currently follows Dr Andrew Roth and next appt is in December- he states he was advised to do chemotherapy but chose not to have that done He states he has an alkaline water system in his home as well [place] that [place] of fatigue and began taking anabolic steroids in October 2022 Pt is told this is not advised - at risk for cardiac/hepatic and other complications He states at this time he is taking [id number] and would like testosterone levels checked  Pt with history of anxiety and currently taking cymbalta 46m38m - states this medication works well for him   Last labwork did show elevated of hgb a1c at 6.5 - he chose not to go to diabetic teaching -   Pt states that he has had intermittent numbness of right thumb and third finger as well as his left thumb - has been going on for about 3 weeks - discussed trying prednisone first to see if improves symptoms before further intervention or imaging  Pt would like to update tetanus shot  Past Medical History:  Diagnosis Date   Anxiety    takes Cymbalta daily   Arthritis    Asthma    Symbicort inhaler   Cough    productive   Depression    Diabetes mellitus without complication (HCC)    borderline   GERD (gastroesophageal reflux disease)    occasionally but no issues in yrs    History of bronchitis    month and a half ago   History of gout    Joint pain    Lung cancer (HCC)[place]  Pneumonia    continuously for [id number] (postoperative nausea and vomiting)    Shortness of breath dyspnea    occasionally and with exertion    Past Surgical History:  Procedure Laterality Date   APPENDECTOMY  03/2021   BACK SURGERY  05/29/1999   BACK SURGERY     CRYO INTERCOSTAL NERVE BLOCK N/A 12/01/2014   Procedure: CRYO INTERCOSTAL NERVE BLOCK;  Surgeon: SteveMelrose Roth  [facility] Service[facility];  Laterality: N/A;   HERNIA REPAIR Bilateral    JOINT REPLACEMENT Left    knee   KNEE ARTHROSCOPY Left    x 3   VIDEO ASSISTED THORACOSCOPY (VATS)/ LOBECTOMY Left 12/01/2014   Procedure: VIDEO ASSISTED THORACOSCOPY (VATS)/ LOBECTOMY;  Surgeon: SteveMelrose Roth  [facility];  Laterality: Left;   VIDEO BRONCHOSCOPY Bilateral 08/16/2014   Procedure: VIDEO BRONCHOSCOPY WITHOUT FLUORO;  Surgeon: MichaTanda Roth  Location: [facility];  Service: Endoscopy;  Laterality: Bilateral;   VIDEO BRONCHOSCOPY N/A 10/01/2014   Procedure: VIDEO BRONCHOSCOPY;  Surgeon: SteveMelrose Roth  [facility];  Laterality: N/A;   wisdom teeth extracted      Family History  Problem Relation  Age of Onset   Rheum arthritis Mother    Diabetes Mother    Prostate cancer Father    Hypertension Mother    Cancer Father    Heart disease Paternal Grandmother    Heart disease Paternal Grandfather     Social History   Socioeconomic History   Marital status: Married    Spouse name: Andrew Roth   Number of children: 5   Years of education: Not on file   Highest education level: Not on file  Occupational History   Occupation: self employed  Tobacco Use   Smoking status: Never   Smokeless tobacco: Never  Vaping Use   Vaping Use: Some days  Substance and Sexual Activity   Alcohol use: Yes    Alcohol/week: 0.0 standard drinks of alcohol    Comment: rarely   Drug use: No   Sexual activity: Yes  Other Topics Concern    Not on file  Social History Narrative   Not on file   Social Determinants of Health   Financial Resource Strain: Not on file  Food Insecurity: Not on file  Transportation Needs: Not on file  Physical Activity: Not on file  Stress: Not on file  Social Connections: Not on file  Intimate Partner Violence: Not on file     Current Outpatient Medications:    amLODipine (NORVASC) 10 MG tablet, Take 1 tablet (10 mg total) by mouth daily., Disp: 90 tablet, Rfl: 1   ascorbic acid (VITAMIN C) 1000 MG tablet, Take by mouth., Disp: , Rfl:    DULoxetine (CYMBALTA) 30 MG capsule, Take 1 capsule (30 mg total) by mouth daily., Disp: 90 capsule, Rfl: 1   DULoxetine (CYMBALTA) 60 MG capsule, Take 1 capsule (60 mg total) by mouth daily., Disp: 90 capsule, Rfl: 1   HYDROcodone bit-homatropine (HYCODAN) 5-1.5 MG/5ML syrup, Take 5 mLs by mouth every 6 (six) hours as needed for cough., Disp: 120 mL, Rfl: 0   milk thistle 175 MG tablet, Take 175 mg by mouth daily., Disp: , Rfl:    Multiple Vitamin (MULTI-VITAMIN) tablet, Take 1 tablet by mouth daily., Disp: , Rfl:    NON FORMULARY, Takes a vitamin injection every other week, Disp: , Rfl:    Omega-3 Fatty Acids (FISH OIL) 1200 MG CAPS, Take 1,200 mg by mouth daily., Disp: , Rfl:    predniSONE (DELTASONE) 20 MG tablet, Take 3 tablets (60 mg total) by mouth daily with breakfast for 3 days, THEN 2 tablets (40 mg total) daily with breakfast for 3 days, THEN 1 tablet (20 mg total) daily with breakfast for 3 days., Disp: 18 tablet, Rfl: 0   Allergies  Allergen Reactions   Naproxen     Put him in the hospital Other reaction(s): Other (See Comments) Put him in the hospital, gallbladder spasms  Put him in the hospital    Ciprofloxacin Other (See Comments)    Stomach cramps  Other reaction(s): Other (See Comments) Stomach cramps Stomach cramps    Clarithromycin    CONSTITUTIONAL: Negative for chills, fatigue, fever, unintentional weight gain and  unintentional weight loss.  E/N/T: Negative for ear pain, nasal congestion and sore throat.  CARDIOVASCULAR: Negative for chest pain, dizziness, palpitations and pedal edema.  RESPIRATORY: Negative for recent cough and dyspnea.  GASTROINTESTINAL: Negative for abdominal pain, acid reflux symptoms, constipation, diarrhea, nausea and vomiting.  MSK: Negative for arthralgias and myalgias.  INTEGUMENTARY: Negative for rash.  NEUROLOGICAL: see HPI     Objective:    PHYSICAL EXAM:  VS: BP 128/82 (BP Location: Left Arm, Patient Position: Sitting, Cuff Size: Large)   Pulse 91   Temp (!) 97.5 F (36.4 C) (Temporal)   Ht _0  (1.778 m)   Wt 219 lb 6.4 oz (99.5 kg)   SpO2 94%   BMI 31.48 kg/m   GEN: Well nourished, well developed, in no acute distress   Cardiac: RRR; no murmurs, rubs, or gallops,no edema -  Respiratory:  normal respiratory rate and pattern with no distress - normal breath sounds with no rales, rhonchi, wheezes or rubs  Skin: warm and dry, no rash  Neuro:  Alert and Oriented x 3,  Psych: euthymic mood, appropriate affect and demeanor   There are no preventive care reminders to display for this patient.  There are no preventive care reminders to display for this patient.  Lab Results  Component Value Date   TSH 0.893 06/28/2021   Lab Results  Component Value Date   WBC 6.5 06/28/2021   HGB 15.9 06/28/2021   HCT 47.8 06/28/2021   MCV 86 06/28/2021   PLT 375 06/28/2021   Lab Results  Component Value Date   NA 137 06/28/2021   K 4.7 06/28/2021   CO2 23 06/28/2021   GLUCOSE 174 (H) 06/28/2021   BUN 12 06/28/2021   CREATININE 1.19 06/28/2021   BILITOT 0.5 06/28/2021   ALKPHOS 69 06/28/2021   AST 29 06/28/2021   ALT 42 06/28/2021   PROT 6.8 06/28/2021   ALBUMIN 4.6 06/28/2021   CALCIUM 9.3 06/28/2021   ANIONGAP 6 12/03/2014   EGFR 75 06/28/2021   Lab Results  Component Value Date   CHOL 190 06/28/2021   Lab Results  Component Value Date   HDL  33 (L) 06/28/2021   Lab Results  Component Value Date   LDLCALC 127 (H) 06/28/2021   Lab Results  Component Value Date   TRIG 167 (H) 06/28/2021   Lab Results  Component Value Date   CHOLHDL 5.8 (H) 06/28/2021   Lab Results  Component Value Date   HGBA1C 6.5 (H) 06/28/2021      Assessment & Plan:   Problem List Items Addressed This Visit   None Visit Diagnoses     Benign hypertension    -  Primary   Relevant Orders   CBC with Differential/Platelet   Comprehensive metabolic panel   Lipid panel Continue current meds   Other fatigue       Relevant Orders   CBC with Differential/Platelet   Comprehensive metabolic panel   Testosterone,Free and Total   TSH  ANXIETY Continue current meds as directed  Paresthesias Rx for prednisone  Need for Tdap Tdap given  Anabolic steroid use Labwork pending  Hyperglycemia Hgb a1c pending       Meds ordered this encounter  Medications   predniSONE (DELTASONE) 20 MG tablet    Sig: Take 3 tablets (60 mg total) by mouth daily with breakfast for 3 days, THEN 2 tablets (40 mg total) daily with breakfast for 3 days, THEN 1 tablet (20 mg total) daily with breakfast for 3 days.    Dispense:  18 tablet    Refill:  0    Order Specific Question:   Supervising Provider    Answer:   Shelton Silvas    Follow-up: Return in about 6 months (around 07/07/2022) for chronic fasting follow up.    SARA R Aijalon Demuro, PA-C

## 2022-01-10 LAB — COMPREHENSIVE METABOLIC PANEL
ALT: 41 IU/L (ref 0–44)
AST: 27 IU/L (ref 0–40)
Albumin/Globulin Ratio: 1.9 (ref 1.2–2.2)
Albumin: 4.7 g/dL (ref 4.1–5.1)
Alkaline Phosphatase: 79 IU/L (ref 44–121)
BUN/Creatinine Ratio: 13 (ref 9–20)
BUN: 15 mg/dL (ref 6–24)
Bilirubin Total: 0.8 mg/dL (ref 0.0–1.2)
CO2: 22 mmol/L (ref 20–29)
Calcium: 9.2 mg/dL (ref 8.7–10.2)
Chloride: 99 mmol/L (ref 96–106)
Creatinine, Ser: 1.12 mg/dL (ref 0.76–1.27)
Globulin, Total: 2.5 g/dL (ref 1.5–4.5)
Glucose: 126 mg/dL — ABNORMAL HIGH (ref 70–99)
Potassium: 4.5 mmol/L (ref 3.5–5.2)
Sodium: 138 mmol/L (ref 134–144)
Total Protein: 7.2 g/dL (ref 6.0–8.5)
eGFR: 81 mL/min/{1.73_m2} (ref >59)

## 2022-01-10 LAB — CBC WITH DIFFERENTIAL/PLATELET
Basophils Absolute: 0.1 10*3/uL (ref 0.0–0.2)
Basos: 1 %
EOS (ABSOLUTE): 0.2 10*3/uL (ref 0.0–0.4)
Eos: 2 %
Hematocrit: 50.7 % (ref 37.5–51.0)
Hemoglobin: 17 g/dL (ref 13.0–17.7)
Immature Grans (Abs): 0 10*3/uL (ref 0.0–0.1)
Immature Granulocytes: 0 %
Lymphocytes Absolute: 2.5 10*3/uL (ref 0.7–3.1)
Lymphs: 34 %
MCH: 28.7 pg (ref 26.6–33.0)
MCHC: 33.5 g/dL (ref 31.5–35.7)
MCV: 86 fL (ref 79–97)
Monocytes Absolute: 0.7 10*3/uL (ref 0.1–0.9)
Monocytes: 9 %
Neutrophils Absolute: 3.8 10*3/uL (ref 1.4–7.0)
Neutrophils: 54 %
Platelets: 378 10*3/uL (ref 150–450)
RBC: 5.93 x10E6/uL — ABNORMAL HIGH (ref 4.14–5.80)
RDW: 12.1 % (ref 11.6–15.4)
WBC: 7.2 10*3/uL (ref 3.4–10.8)

## 2022-01-10 LAB — LIPID PANEL
Chol/HDL Ratio: 5.1 ratio — ABNORMAL HIGH (ref 0.0–5.0)
Cholesterol, Total: 190 mg/dL (ref 100–199)
HDL: 37 mg/dL — ABNORMAL LOW (ref >39)
LDL Chol Calc (NIH): 125 mg/dL — ABNORMAL HIGH (ref 0–99)
Triglycerides: 156 mg/dL — ABNORMAL HIGH (ref 0–149)
VLDL Cholesterol Cal: 28 mg/dL (ref 5–40)

## 2022-01-10 LAB — CARDIOVASCULAR RISK ASSESSMENT

## 2022-01-10 LAB — TSH: TSH: 1.13 u[IU]/mL (ref 0.450–4.500)

## 2022-01-10 LAB — VITAMIN D 25 HYDROXY (VIT D DEFICIENCY, FRACTURES): Vit D, 25-Hydroxy: 43.4 ng/mL (ref 30.0–100.0)

## 2022-01-10 LAB — TESTOSTERONE,FREE AND TOTAL
Testosterone, Free: 17.6 pg/mL (ref 6.8–21.5)
Testosterone: 643 ng/dL (ref 264–916)

## 2022-01-10 LAB — HEMOGLOBIN A1C
Est. average glucose Bld gHb Est-mCnc: 126 mg/dL
Hgb A1c MFr Bld: 6 % — ABNORMAL HIGH (ref 4.8–5.6)

## 2022-01-16 ENCOUNTER — Telehealth: Payer: Self-pay

## 2022-01-16 ENCOUNTER — Other Ambulatory Visit: Payer: Self-pay | Admitting: Physician Assistant

## 2022-01-16 DIAGNOSIS — M5412 Radiculopathy, cervical region: Secondary | ICD-10-CM

## 2022-01-16 NOTE — Telephone Encounter (Signed)
Patient made aware. Andrew Roth 01/16/22 2:32 PM

## 2022-01-16 NOTE — Telephone Encounter (Signed)
Patient came in a couple weeks ago for finger numbness on both hands, he stated that nothing has change sinc appointment and would like a referral to [facility], their phone number is [phone]. Please advise

## 2022-04-26 ENCOUNTER — Other Ambulatory Visit: Payer: Self-pay | Admitting: Oncology

## 2022-04-26 DIAGNOSIS — C3492 Malignant neoplasm of unspecified part of left bronchus or lung: Secondary | ICD-10-CM

## 2022-04-26 NOTE — Progress Notes (Deleted)
Andrew Roth  [address] [place],  [place]  [address] [phone]  Clinic Day:  04/26/2022  Referring physician: No ref. provider found  This document serves as a record of services personally performed by Hosie Poisson, MD. It was created on their behalf by Curry,Lauren E, a trained medical scribe. The creation of this record is based on the scribe's personal observations and the provider's statements to them.  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  He declined adjuvant chemotherapy but has had no evidence of disease.    COVID back in August 2021 with residual shortness of breath with exertion, sinus drainage/pressure and short term memory loss. He has now returned to baseline.  Recent appendicitis, status post appendectomy October 2022.  Hepatic steatosis, as seen on CT abdomen and pelvis from October 2022. The SGPT is mildly elevated and we will continue to monitor.  I will add a vitamin B12 and D level today as requested, and will call him with the results as well as his CEA. I will refill his cough medicine today. As it has been over 1 year since his last CT chest, we will schedule this now. Otherwise, I will see him back in 1 year with CBC, comprehensive metabolic profile, CEA, and CT thorax.  He understands and agrees with this plan of care.   I provided 20 minutes of face-to-face time during this this encounter and > 50% was spent counseling as documented under my assessment and plan.    [name], MD [facility] [address] [place] [address] Dept: [phone] Dept Fax: [phone]    CHIEF COMPLAINT:  CC: History of stage IIB adenocarcinoma of the left lower lobe  Current Treatment:  Surveillance   HISTORY OF PRESENT ILLNESS:  Andrew Roth is a 51 y.o. 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  We began seeing him in May 2017, when he transferred his care here.  He was treated with surgical resection in July 2016.  Pathology revealed a 7.2 cm well-differentiated adenocarcinoma with 8 lymph nodes negative for metastasis.  We do not have the results of any genomic testing done on the tumor.  Adjuvant chemotherapy was recommended, but he declined.  Postoperative PET scan in August 2016 did not reveal any evidence of malignancy.  CT chest in November 2017 revealed postsurgical changes in the left lower lobe, but no evidence of malignancy.  CT chest in July 2018 did not reveal any evidence of malignancy.  The patient has never smoked.  He had Prevnar 13 and Pneumovax 23 in 2018. His father was diagnosed with colon cancer at age 10 and a history of prostate cancer.  He did have COVID in August 2021.   INTERVAL HISTORY:  I have reviewed his chart and materials related to his cancer extensively and collaborated history with the patient. Summary of oncologic history is as follows: Oncology History   No history exists.    Andrew Roth is here for annual follow up and states that he has been fairly well. His cough has improved, but he still has occasional spells. I will send in a refill of his cough medicine today. He recently had appendicitis in October and required appendectomy. He is now back in the gym. He was receiving B12  injections, but has not had one for a long time. Blood counts and chemistries are unremarkable except for an SGPT of 52. CT of the abdomen and pelvis from October did reveal hepatic steatosis. His  appetite is good, and he has gained 12 pounds since his last visit over a year ago.  He denies fever, chills or other signs of infection.  He denies nausea, vomiting, bowel issues, or abdominal pain.  He denies sore throat, dyspnea, or chest pain.  HISTORY:   Allergies:  Allergies  Allergen  Reactions   Naproxen     Put him in the hospital Other reaction(s): Other (See Comments) Put him in the hospital, gallbladder spasms  Put him in the hospital    Ciprofloxacin Other (See Comments)    Stomach cramps  Other reaction(s): Other (See Comments) Stomach cramps Stomach cramps    Clarithromycin     Current Medications: Current Outpatient Medications  Medication Sig Dispense Refill   amLODipine (NORVASC) 10 MG tablet Take 1 tablet (10 mg total) by mouth daily. 90 tablet 1   ascorbic acid (VITAMIN C) 1000 MG tablet Take by mouth.     DULoxetine (CYMBALTA) 30 MG capsule Take 1 capsule (30 mg total) by mouth daily. 90 capsule 1   DULoxetine (CYMBALTA) 60 MG capsule Take 1 capsule (60 mg total) by mouth daily. 90 capsule 1   Multiple Vitamin (MULTI-VITAMIN) tablet Take 1 tablet by mouth daily.     NON FORMULARY Takes a vitamin injection every other week     Omega-3 Fatty Acids (FISH OIL) 1200 MG CAPS Take 1,200 mg by mouth daily.     No current facility-administered medications for this visit.    REVIEW OF SYSTEMS:  Review of Systems  Constitutional:  Positive for fatigue. Negative for appetite change, chills, fever and unexpected weight change.  HENT:  Negative.    Eyes: Negative.   Respiratory:  Positive for cough (improved). Negative for chest tightness, hemoptysis, shortness of breath and wheezing.   Cardiovascular: Negative.  Negative for chest pain, leg swelling and palpitations.  Gastrointestinal: Negative.  Negative for abdominal distention, abdominal pain, blood in stool, constipation, diarrhea, nausea and vomiting.  Endocrine: Negative.   Genitourinary: Negative.  Negative for difficulty urinating, dysuria, frequency and hematuria.   Musculoskeletal: Negative.  Negative for arthralgias, back pain, flank pain, gait problem and myalgias.  Skin: Negative.   Neurological: Negative.  Negative for dizziness, extremity weakness, gait problem, headaches,  light-headedness, numbness, seizures and speech difficulty.  Hematological: Negative.   Psychiatric/Behavioral:  Negative for depression and sleep disturbance. The patient is not nervous/anxious.   All other systems reviewed and are negative.     VITALS:  There were no vitals taken for this visit.  Wt Readings from Last 3 Encounters:  01/04/22 219 lb 6.4 oz (99.5 kg)  06/28/21 228 lb 6.4 oz (103.6 kg)  04/28/21 224 lb 3.2 oz (101.7 kg)    There is no height or weight on file to calculate BMI.  Performance status (ECOG): 1 - Symptomatic but completely ambulatory  PHYSICAL EXAM:  Physical Exam Constitutional:      General: He is not in acute distress.    Appearance: Normal appearance. He is normal weight.  HENT:     Head: Normocephalic and atraumatic.  Eyes:     General: No scleral icterus.    Extraocular Movements: Extraocular movements intact.     Conjunctiva/sclera: Conjunctivae normal.     Pupils: Pupils are equal, round, and reactive to light.  Cardiovascular:     Rate and Rhythm: Regular rhythm. Tachycardia present.     Pulses: Normal pulses.     Heart sounds: Normal heart sounds. No murmur heard.    No friction rub. No gallop.  Pulmonary:     Effort: Pulmonary effort is normal. No respiratory distress.     Breath sounds: Normal breath sounds.  Abdominal:     General: Bowel sounds are normal. There is no distension.     Palpations: Abdomen is soft. There is no hepatomegaly, splenomegaly or mass.     Tenderness: There is no abdominal tenderness.     Comments: Small incisions in the midline lower abdomen which are healing very well.  Musculoskeletal:        General: Normal range of motion.     Cervical back: Normal range of motion and neck supple.     Right lower leg: No edema.     Left lower leg: No edema.  Lymphadenopathy:     Cervical: No cervical adenopathy.  Skin:    General: Skin is warm and dry.  Neurological:     General: No focal deficit present.      Mental Status: He is alert and oriented to person, place, and time. Mental status is at baseline.  Psychiatric:        Mood and Affect: Mood normal.        Behavior: Behavior normal.        Thought Content: Thought content normal.        Judgment: Judgment normal.     LABS:      Latest Ref Rng & Units 01/04/2022    8:50 AM 06/28/2021    9:18 AM 04/28/2021   12:00 AM  CBC  WBC 3.4 - 10.8 x10E3/uL 7.2  6.5  5.8      Hemoglobin 13.0 - 17.7 g/dL 17.0  15.9  15.6      Hematocrit 37.5 - 51.0 % 50.7  47.8  45      Platelets 150 - 450 x10E3/uL 378  375  290         This result is from an external source.       Latest Ref Rng & Units 01/04/2022    8:50 AM 06/28/2021    9:18 AM 04/28/2021   12:00 AM  CMP  Glucose 70 - 99 mg/dL 126  174    BUN 6 - 24 mg/dL 15  12  16       Creatinine 0.76 - 1.27 mg/dL 1.12  1.19  0.9      Sodium 134 - 144 mmol/L 138  137  138      Potassium 3.5 - 5.2 mmol/L 4.5  4.7  3.9      Chloride 96 - 106 mmol/L 99  97  105      CO2 20 - 29 mmol/L 22  23  24       Calcium 8.7 - 10.2 mg/dL 9.2  9.3  9.1      Total Protein 6.0 - 8.5 g/dL 7.2  6.8    Total Bilirubin 0.0 - 1.2 mg/dL 0.8  0.5    Alkaline Phos 44 - 121 IU/L 79  69  62      AST 0 - 40 IU/L 27  29  41      ALT 0 - 44 IU/L 41  42  52         This result is from an external source.  Lab Results  Component Value Date   CEA1 1.2 04/28/2021   /  CEA  Date Value Ref Range Status  04/28/2021 1.2 0.0 - 4.7 ng/mL Final    Comment:    (NOTE)                             Nonsmokers          <3.9                             Smokers             <5.6 [employer] Electrochemiluminescence Immunoassay (ECLIA) Values obtained with different assay methods or kits cannot be used interchangeably.  Results cannot be interpreted as absolute evidence of the presence or absence of malignant disease. Performed At: [facility] [place], [place] [id number] Rush Farmer MD  QH:[id number]      STUDIES:  No results found.    I, Rita Ohara, am acting as scribe for [name], MD  I have reviewed this report as typed by the medical scribe, and it is complete and accurate.

## 2022-04-27 ENCOUNTER — Inpatient Hospital Stay: Payer: Commercial Managed Care - PPO | Admitting: Oncology

## 2022-04-27 DIAGNOSIS — C3492 Malignant neoplasm of unspecified part of left bronchus or lung: Secondary | ICD-10-CM

## 2022-05-03 NOTE — Progress Notes (Signed)
[facility]  [address] 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  He declined adjuvant chemotherapy but has had no evidence of recurrence.    2.   Hepatic steatosis, as seen on CT abdomen and pelvis from October 2022. The SGPT is mildly elevated and we will continue to monitor.  We drew his labs today and we will schedule him for CT of the chest, and call him with those results.  He will have carpal tunnel surgery in 2 weeks.  I will plan to see him back in 1 year with CBC, CMP, CEA, and CT of the chest, unless there is need to see him sooner.  He understands and agrees with this plan of care.   I provided 20 minutes of face-to-face time during this this encounter and > 50% was spent counseling as documented under my assessment and plan.    [name], MD [facility] [address] [place] [address] Dept: [phone] Dept Fax: [phone]    CHIEF COMPLAINT:  CC: History of stage IIB adenocarcinoma of the left lower lobe  Current Treatment:  Surveillance   HISTORY OF PRESENT ILLNESS:  Natanael Saladin is a 51 y.o. 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  We began seeing him in May 2017, when he transferred his care here.  He was treated with surgical resection in July 2016.  Pathology revealed a 7.2 cm well-differentiated adenocarcinoma with 8 lymph nodes negative for metastasis.  We do not have the results of any genomic testing done on the tumor.  Adjuvant chemotherapy was recommended, but he declined.  Postoperative PET scan in August 2016 did not reveal any evidence of malignancy.  CT chest in November 2017  revealed postsurgical changes in the left lower lobe, but no evidence of malignancy.  CT chest in July 2018 did not reveal any evidence of malignancy.  The patient has never smoked.  He had Prevnar 13 and Pneumovax 23 in 2018. His father was diagnosed with colon cancer at age 51 and a history of prostate cancer.  He did have COVID in August 2021.   INTERVAL HISTORY:  I have reviewed his chart and materials related to his cancer extensively and collaborated history with the patient. Summary of oncologic history is as follows: Oncology History   No history exists.    Eldo is here for annual follow up and states that he has been fairly well. His cough has improved, but he still has occasional spells.  He has carpal tunnel on the right and planned surgery in 2 weeks.  He is planning on a cruise and will be back in 1 week.  I will send in a refill of his cough medicine today. He had an appendectomy in 2022.  His last CT scan was December 2022 and he did not get 1 yet this year so I will get that scheduled.  Blood counts and chemistries are unremarkable except for an SGPT of 50.  His  appetite is good.  Refuses to wear a mask and so was seen in the front exam room and a weight was not obtained today.  He denies fever,  chills or other signs of infection.  He denies nausea, vomiting, bowel issues, or abdominal pain.  He denies sore throat, dyspnea, or chest pain.  HISTORY:   Allergies:  Allergies  Allergen Reactions   Naproxen     Put him in the hospital Other reaction(s): Other (See Comments) Put him in the hospital, gallbladder spasms  Put him in the hospital    Ciprofloxacin Other (See Comments)    Stomach cramps  Other reaction(s): Other (See Comments) Stomach cramps Stomach cramps    Clarithromycin     Current Medications: Current Outpatient Medications  Medication Sig Dispense Refill   amLODipine (NORVASC) 10 MG tablet Take 1 tablet (10 mg total) by mouth daily. 90 tablet 1    ascorbic acid (VITAMIN C) 1000 MG tablet Take by mouth.     DULoxetine (CYMBALTA) 30 MG capsule Take 1 capsule (30 mg total) by mouth daily. 90 capsule 1   DULoxetine (CYMBALTA) 60 MG capsule Take 1 capsule (60 mg total) by mouth daily. 90 capsule 1   Multiple Vitamin (MULTI-VITAMIN) tablet Take 1 tablet by mouth daily.     NON FORMULARY Takes a vitamin injection every other week     Omega-3 Fatty Acids (FISH OIL) 1200 MG CAPS Take 1,200 mg by mouth daily.     No current facility-administered medications for this visit.    REVIEW OF SYSTEMS:  Review of Systems  Constitutional:  Positive for fatigue. Negative for appetite change, chills, fever and unexpected weight change.  HENT:  Negative.    Eyes: Negative.   Respiratory:  Positive for cough (improved). Negative for chest tightness, hemoptysis, shortness of breath and wheezing.   Cardiovascular: Negative.  Negative for chest pain, leg swelling and palpitations.  Gastrointestinal: Negative.  Negative for abdominal distention, abdominal pain, blood in stool, constipation, diarrhea, nausea and vomiting.  Endocrine: Negative.   Genitourinary: Negative.  Negative for difficulty urinating, dysuria, frequency and hematuria.   Musculoskeletal: Negative.  Negative for arthralgias, back pain, flank pain, gait problem and myalgias.  Skin: Negative.   Neurological: Negative.  Negative for dizziness, extremity weakness, gait problem, headaches, light-headedness, numbness, seizures and speech difficulty.  Hematological: Negative.   Psychiatric/Behavioral:  Negative for depression and sleep disturbance. The patient is not nervous/anxious.   All other systems reviewed and are negative.     VITALS:  Blood pressure (!) 148/107, pulse (!) 109, temperature 98.2 F (36.8 C), temperature source Oral, resp. rate 18, SpO2 100 %.  Wt Readings from Last 3 Encounters:  01/04/22 219 lb 6.4 oz (99.5 kg)  06/28/21 228 lb 6.4 oz (103.6 kg)  04/28/21 224 lb 3.2  oz (101.7 kg)    There is no height or weight on file to calculate BMI.  Performance status (ECOG): 1 - Symptomatic but completely ambulatory  PHYSICAL EXAM:  Physical Exam Constitutional:      General: He is not in acute distress.    Appearance: Normal appearance. He is normal weight.  HENT:     Head: Normocephalic and atraumatic.  Eyes:     General: No scleral icterus.    Extraocular Movements: Extraocular movements intact.     Conjunctiva/sclera: Conjunctivae normal.     Pupils: Pupils are equal, round, and reactive to light.  Cardiovascular:     Rate and Rhythm: Regular rhythm. Tachycardia present.     Pulses: Normal pulses.     Heart sounds: Normal heart sounds. No murmur heard.    No friction rub. No gallop.  Pulmonary:  Effort: Pulmonary effort is normal. No respiratory distress.     Breath sounds: Normal breath sounds.  Abdominal:     General: Bowel sounds are normal. There is no distension.     Palpations: Abdomen is soft. There is no hepatomegaly, splenomegaly or mass.     Tenderness: There is no abdominal tenderness.     Comments: Small incisions in the midline lower abdomen which are healing very well.  Musculoskeletal:        General: Normal range of motion.     Cervical back: Normal range of motion and neck supple.     Right lower leg: No edema.     Left lower leg: No edema.  Lymphadenopathy:     Cervical: No cervical adenopathy.  Skin:    General: Skin is warm and dry.  Neurological:     General: No focal deficit present.     Mental Status: He is alert and oriented to person, place, and time. Mental status is at baseline.  Psychiatric:        Mood and Affect: Mood normal.        Behavior: Behavior normal.        Thought Content: Thought content normal.        Judgment: Judgment normal.     LABS:      Latest Ref Rng & Units 05/07/2022   10:59 AM 01/04/2022    8:50 AM 06/28/2021    9:18 AM  CBC  WBC 4.0 - 10.5 K/uL 7.0  7.2  6.5   Hemoglobin  13.0 - 17.0 g/dL 17.5  17.0  15.9   Hematocrit 39.0 - 52.0 % 52.9  50.7  47.8   Platelets 150 - 400 K/uL 340  378  375       Latest Ref Rng & Units 05/07/2022   10:59 AM 01/04/2022    8:50 AM 06/28/2021    9:18 AM  CMP  Glucose 70 - 99 mg/dL 146  126  174   BUN 6 - 20 mg/dL 13  15  12    Creatinine 0.61 - 1.24 mg/dL 1.10  1.12  1.19   Sodium 135 - 145 mmol/L 136  138  137   Potassium 3.5 - 5.1 mmol/L 4.2  4.5  4.7   Chloride 98 - 111 mmol/L 100  99  97   CO2 22 - 32 mmol/L 26  22  23    Calcium 8.9 - 10.3 mg/dL 9.2  9.2  9.3   Total Protein 6.5 - 8.1 g/dL 7.6  7.2  6.8   Total Bilirubin 0.3 - 1.2 mg/dL 1.0  0.8  0.5   Alkaline Phos 38 - 126 U/L 59  79  69   AST 15 - 41 U/L 30  27  29    ALT 0 - 44 U/L 50  41  42      Lab Results  Component Value Date   CEA1 1.2 05/07/2022   /  CEA  Date Value Ref Range Status  05/07/2022 1.2 0.0 - 4.7 ng/mL Final    Comment:    (NOTE)                             Nonsmokers          <3.9                             Smokers             <  5.6 [employer] Electrochemiluminescence Immunoassay (ECLIA) Values obtained with different assay methods or kits cannot be used interchangeably.  Results cannot be interpreted as absolute evidence of the presence or absence of malignant disease. Performed At: [facility] [place], [place] [id number] Rush Farmer MD WC:[id number]      STUDIES:  No results found.  EXAM:05/10/22 CT CHEST WITHOUT CONTRAST IMPRESSION: Areas of focal ground-glass in the RIGHT lower lobe, overall ground-glass changes have diminished since previous imaging. These are difficult to classify as new or persistent given the diffuse nature of the abnormality on the previous study but given generalized improvement would favor sequela of infection or inflammation or possibly cryptogenic organizing pneumonia. Given patient's history of pulmonary neoplasm would suggest attention on subsequent imaging, could  consider 3 to 6 month follow up given more focal appearance on the current study.  Signs of hepatic steatosis  Parenchymal distortion following LEFT lower lobectomy as before.

## 2022-05-07 ENCOUNTER — Inpatient Hospital Stay: Payer: Commercial Managed Care - PPO

## 2022-05-07 ENCOUNTER — Encounter: Payer: Self-pay | Admitting: Oncology

## 2022-05-07 ENCOUNTER — Inpatient Hospital Stay: Payer: Commercial Managed Care - PPO | Attending: Oncology | Admitting: Oncology

## 2022-05-07 ENCOUNTER — Other Ambulatory Visit: Payer: Self-pay | Admitting: Oncology

## 2022-05-07 VITALS — BP 148/107 | HR 109 | Temp 98.2°F | Resp 18

## 2022-05-07 DIAGNOSIS — C3432 Malignant neoplasm of lower lobe, left bronchus or lung: Secondary | ICD-10-CM | POA: Insufficient documentation

## 2022-05-07 DIAGNOSIS — K760 Fatty (change of) liver, not elsewhere classified: Secondary | ICD-10-CM

## 2022-05-07 DIAGNOSIS — C3492 Malignant neoplasm of unspecified part of left bronchus or lung: Secondary | ICD-10-CM

## 2022-05-07 LAB — CMP (CANCER CENTER ONLY)
ALT: 50 U/L — ABNORMAL HIGH (ref 0–44)
AST: 30 U/L (ref 15–41)
Albumin: 4.3 g/dL (ref 3.5–5.0)
Alkaline Phosphatase: 59 U/L (ref 38–126)
Anion gap: 10 (ref 5–15)
BUN: 13 mg/dL (ref 6–20)
CO2: 26 mmol/L (ref 22–32)
Calcium: 9.2 mg/dL (ref 8.9–10.3)
Chloride: 100 mmol/L (ref 98–111)
Creatinine: 1.1 mg/dL (ref 0.61–1.24)
GFR, Estimated: >60 mL/min (ref >60)
Glucose, Bld: 146 mg/dL — ABNORMAL HIGH (ref 70–99)
Potassium: 4.2 mmol/L (ref 3.5–5.1)
Sodium: 136 mmol/L (ref 135–145)
Total Bilirubin: 1 mg/dL (ref 0.3–1.2)
Total Protein: 7.6 g/dL (ref 6.5–8.1)

## 2022-05-07 LAB — CBC WITH DIFFERENTIAL (CANCER CENTER ONLY)
Abs Immature Granulocytes: 0.02 10*3/uL (ref 0.00–0.07)
Basophils Absolute: 0 10*3/uL (ref 0.0–0.1)
Basophils Relative: 1 %
Eosinophils Absolute: 0.2 10*3/uL (ref 0.0–0.5)
Eosinophils Relative: 2 %
HCT: 52.9 % — ABNORMAL HIGH (ref 39.0–52.0)
Hemoglobin: 17.5 g/dL — ABNORMAL HIGH (ref 13.0–17.0)
Immature Granulocytes: 0 %
Lymphocytes Relative: 31 %
Lymphs Abs: 2.2 10*3/uL (ref 0.7–4.0)
MCH: 27.9 pg (ref 26.0–34.0)
MCHC: 33.1 g/dL (ref 30.0–36.0)
MCV: 84.2 fL (ref 80.0–100.0)
Monocytes Absolute: 0.6 10*3/uL (ref 0.1–1.0)
Monocytes Relative: 9 %
Neutro Abs: 3.9 10*3/uL (ref 1.7–7.7)
Neutrophils Relative %: 57 %
Platelet Count: 340 10*3/uL (ref 150–400)
RBC: 6.28 MIL/uL — ABNORMAL HIGH (ref 4.22–5.81)
RDW: 14.2 % (ref 11.5–15.5)
WBC Count: 7 10*3/uL (ref 4.0–10.5)
nRBC: 0 % (ref 0.0–0.2)

## 2022-05-08 LAB — CEA: CEA: 1.2 ng/mL (ref 0.0–4.7)

## 2022-06-06 DIAGNOSIS — K760 Fatty (change of) liver, not elsewhere classified: Secondary | ICD-10-CM | POA: Insufficient documentation

## 2022-06-07 ENCOUNTER — Telehealth: Payer: Self-pay

## 2022-06-07 NOTE — Telephone Encounter (Signed)
Patient did not show up for his CT chest. It has been rescheduled several times per Damaris.

## 2022-06-07 NOTE — Telephone Encounter (Signed)
-----   Message from [name], MD sent at 06/06/2022  7:31 PM EST ----- Regarding: CT Did he ever get his CT chest?

## 2022-06-21 ENCOUNTER — Other Ambulatory Visit: Payer: Self-pay | Admitting: Physician Assistant

## 2022-06-21 DIAGNOSIS — F419 Anxiety disorder, unspecified: Secondary | ICD-10-CM

## 2022-06-21 DIAGNOSIS — I10 Essential (primary) hypertension: Secondary | ICD-10-CM

## 2022-07-10 ENCOUNTER — Ambulatory Visit: Payer: Commercial Managed Care - PPO | Admitting: Physician Assistant

## 2022-07-24 ENCOUNTER — Other Ambulatory Visit: Payer: Self-pay

## 2022-07-24 DIAGNOSIS — I10 Essential (primary) hypertension: Secondary | ICD-10-CM

## 2022-07-24 DIAGNOSIS — F419 Anxiety disorder, unspecified: Secondary | ICD-10-CM

## 2022-07-24 MED ORDER — AMLODIPINE BESYLATE 10 MG PO TABS: 10.0000 mg | ORAL_TABLET | Freq: Every day | ORAL | 0 refills | Status: DC

## 2022-07-24 MED ORDER — DULOXETINE HCL 60 MG PO CPEP: 60.0000 mg | ORAL_CAPSULE | Freq: Every day | ORAL | 0 refills | Status: DC

## 2022-07-24 MED ORDER — DULOXETINE HCL 30 MG PO CPEP: 30.0000 mg | ORAL_CAPSULE | Freq: Every day | ORAL | 0 refills | Status: DC

## 2022-08-13 ENCOUNTER — Ambulatory Visit: Payer: Commercial Managed Care - PPO | Admitting: Physician Assistant

## 2022-08-13 ENCOUNTER — Encounter: Payer: Self-pay | Admitting: Physician Assistant

## 2022-08-13 VITALS — BP 136/88 | HR 88 | Temp 97.3°F | Ht 70.0 in | Wt 222.3 lb

## 2022-08-13 DIAGNOSIS — C3492 Malignant neoplasm of unspecified part of left bronchus or lung: Secondary | ICD-10-CM | POA: Diagnosis not present

## 2022-08-13 DIAGNOSIS — I10 Essential (primary) hypertension: Secondary | ICD-10-CM | POA: Diagnosis not present

## 2022-08-13 DIAGNOSIS — E782 Mixed hyperlipidemia: Secondary | ICD-10-CM | POA: Diagnosis not present

## 2022-08-13 DIAGNOSIS — F419 Anxiety disorder, unspecified: Secondary | ICD-10-CM | POA: Diagnosis not present

## 2022-08-13 DIAGNOSIS — R739 Hyperglycemia, unspecified: Secondary | ICD-10-CM

## 2022-08-13 DIAGNOSIS — Z1211 Encounter for screening for malignant neoplasm of colon: Secondary | ICD-10-CM

## 2022-08-13 DIAGNOSIS — R5383 Other fatigue: Secondary | ICD-10-CM

## 2022-08-13 MED ORDER — DULOXETINE HCL 30 MG PO CPEP: 30.0000 mg | ORAL_CAPSULE | Freq: Every day | ORAL | 5 refills | Status: DC

## 2022-08-13 MED ORDER — DULOXETINE HCL 60 MG PO CPEP: 60.0000 mg | ORAL_CAPSULE | Freq: Every day | ORAL | 5 refills | Status: DC

## 2022-08-13 MED ORDER — AMLODIPINE BESYLATE 10 MG PO TABS: 10.0000 mg | ORAL_TABLET | Freq: Every day | ORAL | 5 refills | Status: DC

## 2022-08-13 NOTE — Progress Notes (Signed)
New Patient Office Visit  Subjective:  Patient ID: Andrew Roth, male    DOB: June 05, 1970  Age: 52 y.o. MRN: [id number]  CC:  Chief Complaint  Patient presents with   Hypertension    HPI Andrew Roth presents for hypertension -pt states that he has been on norvasc 10mg  qd for several years - is currently taking norvasc 10mg  qd Denies chest pain or dyspnea  Pt with history of lung adenocarcinoma - he had left lower lobe resection in 2016 - he currently follows [name] yearly and last appt was in December- he states he was advised to do chemotherapy but chose not to have that done He states he has an alkaline water system in his home as well as receiving vitamin IV Therapy once monthly at a clinic in [place]  Pt states that he has history of fatigue and began taking some type of testosterone supplement (states is different from anabolic steroids) Pt is told this is not advised - at risk for cardiac/hepatic and other complications He states at this time he is taking weekly and would like testosterone levels checked  Pt with history of anxiety and currently taking cymbalta 90mg  qd - states this medication works well for him   Last labwork did show elevated of hgb a1c at 6.5 - he chose not to go to diabetic teaching -  Due to recheck  Pt did have bilateral carpal tunnel surgeries since last visit  Pt would like referral for screening colonoscopy  Past Medical History:  Diagnosis Date   Anxiety    takes Cymbalta daily   Arthritis    Asthma    Symbicort inhaler   Cough    productive   Depression    Diabetes mellitus without complication (HCC)    borderline   GERD (gastroesophageal reflux disease)    occasionally but no issues in yrs    History of bronchitis    month and a half ago   History of gout    Joint pain    Lung cancer ([facility])    Pneumonia    continuously for 98yrs    PONV (postoperative nausea and vomiting)    Shortness of breath dyspnea     occasionally and with exertion    Past Surgical History:  Procedure Laterality Date   APPENDECTOMY  03/2021   BACK SURGERY  05/29/1999   BACK SURGERY     CRYO INTERCOSTAL NERVE BLOCK N/A 12/01/2014   Procedure: CRYO INTERCOSTAL NERVE BLOCK;  Surgeon: [name], MD;  Location: [facility];  Service: Thoracic;  Laterality: N/A;   HERNIA REPAIR Bilateral    JOINT REPLACEMENT Left    knee   KNEE ARTHROSCOPY Left    x 3   VIDEO ASSISTED THORACOSCOPY (VATS)/ LOBECTOMY Left 12/01/2014   Procedure: VIDEO ASSISTED THORACOSCOPY (VATS)/ LOBECTOMY;  Surgeon: [name], MD;  Location: [facility];  Service: Thoracic;  Laterality: Left;   VIDEO BRONCHOSCOPY Bilateral 08/16/2014   Procedure: VIDEO BRONCHOSCOPY WITHOUT FLUORO;  Surgeon: [name], MD;  Location: [facility];  Service: Endoscopy;  Laterality: Bilateral;   VIDEO BRONCHOSCOPY N/A 10/01/2014   Procedure: VIDEO BRONCHOSCOPY;  Surgeon: [name], MD;  Location[facility];  Service: Thoracic;  Laterality: N/A;   wisdom teeth extracted      Family History  Problem Relation Age of Onset   Rheum arthritis Mother    Diabetes Mother    Prostate cancer Father    Hypertension Mother  Cancer Father    Heart disease Paternal Grandmother    Heart disease Paternal Grandfather     Social History   Socioeconomic History   Marital status: Married    Spouse name: Dong Ulland   Number of children: 5   Years of education: Not on file   Highest education level: Not on file  Occupational History   Occupation: self employed  Tobacco Use   Smoking status: Never   Smokeless tobacco: Never  Vaping Use   Vaping Use: Some days  Substance and Sexual Activity   Alcohol use: Yes    Alcohol/week: 0.0 standard drinks of alcohol    Comment: rarely   Drug use: No   Sexual activity: Yes  Other Topics Concern   Not on file  Social History Narrative   Not on file   Social Determinants of Health   Financial Resource  Strain: Not on file  Food Insecurity: Not on file  Transportation Needs: Not on file  Physical Activity: Not on file  Stress: Not on file  Social Connections: Not on file  Intimate Partner Violence: Not on file     Current Outpatient Medications:    ascorbic acid (VITAMIN C) 1000 MG tablet, Take by mouth., Disp: , Rfl:    Multiple Vitamin (MULTI-VITAMIN) tablet, Take 1 tablet by mouth daily., Disp: , Rfl:    NON FORMULARY, Takes a vitamin injection every other week, Disp: , Rfl:    Omega-3 Fatty Acids (FISH OIL) 1200 MG CAPS, Take 1,200 mg by mouth daily., Disp: , Rfl:    amLODipine (NORVASC) 10 MG tablet, Take 1 tablet (10 mg total) by mouth daily., Disp: 30 tablet, Rfl: 5   DULoxetine (CYMBALTA) 30 MG capsule, Take 1 capsule (30 mg total) by mouth daily., Disp: 30 capsule, Rfl: 5   DULoxetine (CYMBALTA) 60 MG capsule, Take 1 capsule (60 mg total) by mouth daily., Disp: 30 capsule, Rfl: 5   Allergies  Allergen Reactions   Naproxen     Put him in the hospital Other reaction(s): Other (See Comments) Put him in the hospital, gallbladder spasms  Put him in the hospital    Ciprofloxacin Other (See Comments)    Stomach cramps  Other reaction(s): Other (See Comments) Stomach cramps Stomach cramps    Clarithromycin    CONSTITUTIONAL: Negative for chills, fatigue, fever, unintentional weight gain and unintentional weight loss.  E/N/T: Negative for ear pain, nasal congestion and sore throat.  CARDIOVASCULAR: Negative for chest pain, dizziness, palpitations and pedal edema.  RESPIRATORY: Negative for recent cough and dyspnea.  GASTROINTESTINAL: Negative for abdominal pain, acid reflux symptoms, constipation, diarrhea, nausea and vomiting.  MSK: Negative for arthralgias and myalgias.  INTEGUMENTARY: Negative for rash.  NEUROLOGICAL: Negative for dizziness and headaches.  PSYCHIATRIC: Negative for sleep disturbance and to question depression screen.  Negative for depression, negative  for anhedonia.        Objective:  PHYSICAL EXAM:   VS: BP 136/88 (BP Location: Left Arm, Patient Position: Sitting, Cuff Size: Large)   Pulse 88   Temp (!) 97.3 F (36.3 C) (Temporal)   Ht 5\' 10"  (1.778 m)   Wt 222 lb 5.1 oz (100.8 kg)   SpO2 97%   BMI 31.90 kg/m   GEN: Well nourished, well developed, in no acute distress  Cardiac: RRR; no murmurs, rubs, or gallops,no edema - Respiratory:  normal respiratory rate and pattern with no distress - normal breath sounds with no rales, rhonchi, wheezes or rubs Skin: warm and  dry, no rash   Psych: euthymic mood, appropriate affect and demeanor    Health Maintenance Due  Topic Date Due   COLONOSCOPY (Pts 45-37yrs Insurance coverage will need to be confirmed)  Never done    There are no preventive care reminders to display for this patient.  Lab Results  Component Value Date   TSH 1.130 01/04/2022   Lab Results  Component Value Date   WBC 7.0 05/07/2022   HGB 17.5 (H) 05/07/2022   HCT 52.9 (H) 05/07/2022   MCV 84.2 05/07/2022   PLT 340 05/07/2022   Lab Results  Component Value Date   NA 136 05/07/2022   K 4.2 05/07/2022   CO2 26 05/07/2022   GLUCOSE 146 (H) 05/07/2022   BUN 13 05/07/2022   CREATININE 1.10 05/07/2022   BILITOT 1.0 05/07/2022   ALKPHOS 59 05/07/2022   AST 30 05/07/2022   ALT 50 (H) 05/07/2022   PROT 7.6 05/07/2022   ALBUMIN 4.3 05/07/2022   CALCIUM 9.2 05/07/2022   ANIONGAP 10 05/07/2022   EGFR 81 01/04/2022   Lab Results  Component Value Date   CHOL 190 01/04/2022   Lab Results  Component Value Date   HDL 37 (L) 01/04/2022   Lab Results  Component Value Date   LDLCALC 125 (H) 01/04/2022   Lab Results  Component Value Date   TRIG 156 (H) 01/04/2022   Lab Results  Component Value Date   CHOLHDL 5.1 (H) 01/04/2022   Lab Results  Component Value Date   HGBA1C 6.0 (H) 01/04/2022      Assessment & Plan:   Problem List Items Addressed This Visit   None Visit Diagnoses      Benign hypertension    -  Primary   Relevant Orders   CBC with Differential/Platelet   Comprehensive metabolic panel   Lipid panel Continue current meds   Other fatigue       Relevant Orders   CBC with Differential/Platelet   Comprehensive metabolic panel   Testosterone,Free and Total   TSH  ANXIETY Continue current meds as directed  Anabolic steroid/testosterone use Labwork pending  Hyperglycemia Hgb a1c pending   Colon cancer screening Refer to Dr Lyda Jester    Meds ordered this encounter  Medications   amLODipine (NORVASC) 10 MG tablet    Sig: Take 1 tablet (10 mg total) by mouth daily.    Dispense:  30 tablet    Refill:  5    Order Specific Question:   Supervising Provider    Answer:   Shelton Silvas   DULoxetine (CYMBALTA) 30 MG capsule    Sig: Take 1 capsule (30 mg total) by mouth daily.    Dispense:  30 capsule    Refill:  5    Order Specific Question:   Supervising Provider    Answer:   Shelton Silvas   DULoxetine (CYMBALTA) 60 MG capsule    Sig: Take 1 capsule (60 mg total) by mouth daily.    Dispense:  30 capsule    Refill:  5    Order Specific Question:   Supervising Provider    Answer:   Shelton Silvas    Follow-up: Return in about 6 months (around 02/13/2023) for chronic fasting follow up.    SARA R Kevontay Burks, PA-C

## 2022-08-14 LAB — COMPREHENSIVE METABOLIC PANEL
ALT: 33 IU/L (ref 0–44)
AST: 18 IU/L (ref 0–40)
Albumin/Globulin Ratio: 1.6 (ref 1.2–2.2)
Albumin: 4.5 g/dL (ref 4.1–5.1)
Alkaline Phosphatase: 70 IU/L (ref 44–121)
BUN/Creatinine Ratio: 13 (ref 9–20)
BUN: 14 mg/dL (ref 6–24)
Bilirubin Total: 0.7 mg/dL (ref 0.0–1.2)
CO2: 26 mmol/L (ref 20–29)
Calcium: 10 mg/dL (ref 8.7–10.2)
Chloride: 95 mmol/L — ABNORMAL LOW (ref 96–106)
Creatinine, Ser: 1.1 mg/dL (ref 0.76–1.27)
Globulin, Total: 2.8 g/dL (ref 1.5–4.5)
Glucose: 88 mg/dL (ref 70–99)
Potassium: 4.7 mmol/L (ref 3.5–5.2)
Sodium: 137 mmol/L (ref 134–144)
Total Protein: 7.3 g/dL (ref 6.0–8.5)
eGFR: 82 mL/min/{1.73_m2} (ref >59)

## 2022-08-14 LAB — CBC WITH DIFFERENTIAL/PLATELET
Basophils Absolute: 0.1 10*3/uL (ref 0.0–0.2)
Basos: 1 %
EOS (ABSOLUTE): 0.2 10*3/uL (ref 0.0–0.4)
Eos: 3 %
Hematocrit: 51.5 % — ABNORMAL HIGH (ref 37.5–51.0)
Hemoglobin: 17.6 g/dL (ref 13.0–17.7)
Immature Grans (Abs): 0 10*3/uL (ref 0.0–0.1)
Immature Granulocytes: 0 %
Lymphocytes Absolute: 2.7 10*3/uL (ref 0.7–3.1)
Lymphs: 39 %
MCH: 29 pg (ref 26.6–33.0)
MCHC: 34.2 g/dL (ref 31.5–35.7)
MCV: 85 fL (ref 79–97)
Monocytes Absolute: 0.6 10*3/uL (ref 0.1–0.9)
Monocytes: 9 %
Neutrophils Absolute: 3.5 10*3/uL (ref 1.4–7.0)
Neutrophils: 48 %
Platelets: 379 10*3/uL (ref 150–450)
RBC: 6.07 x10E6/uL — ABNORMAL HIGH (ref 4.14–5.80)
RDW: 13.6 % (ref 11.6–15.4)
WBC: 7.1 10*3/uL (ref 3.4–10.8)

## 2022-08-14 LAB — LIPID PANEL
Chol/HDL Ratio: 5.7 ratio — ABNORMAL HIGH (ref 0.0–5.0)
Cholesterol, Total: 200 mg/dL — ABNORMAL HIGH (ref 100–199)
HDL: 35 mg/dL — ABNORMAL LOW (ref >39)
LDL Chol Calc (NIH): 132 mg/dL — ABNORMAL HIGH (ref 0–99)
Triglycerides: 185 mg/dL — ABNORMAL HIGH (ref 0–149)
VLDL Cholesterol Cal: 33 mg/dL (ref 5–40)

## 2022-08-14 LAB — TSH: TSH: 1.57 u[IU]/mL (ref 0.450–4.500)

## 2022-08-14 LAB — CARDIOVASCULAR RISK ASSESSMENT

## 2022-08-14 LAB — TESTOSTERONE,FREE AND TOTAL
Testosterone, Free: 30.7 pg/mL — ABNORMAL HIGH (ref 6.8–21.5)
Testosterone: 1116 ng/dL — ABNORMAL HIGH (ref 264–916)

## 2022-08-14 LAB — HEMOGLOBIN A1C
Est. average glucose Bld gHb Est-mCnc: 137 mg/dL
Hgb A1c MFr Bld: 6.4 % — ABNORMAL HIGH (ref 4.8–5.6)

## 2022-08-14 LAB — MAGNESIUM: Magnesium: 2.1 mg/dL (ref 1.6–2.3)

## 2022-09-19 ENCOUNTER — Ambulatory Visit: Payer: Commercial Managed Care - PPO | Admitting: Physician Assistant

## 2022-09-19 ENCOUNTER — Encounter: Payer: Self-pay | Admitting: Physician Assistant

## 2022-09-19 VITALS — BP 118/60 | HR 95 | Temp 97.7°F | Resp 12 | Ht 70.0 in | Wt 214.0 lb

## 2022-09-19 DIAGNOSIS — N5089 Other specified disorders of the male genital organs: Secondary | ICD-10-CM | POA: Diagnosis not present

## 2022-09-19 NOTE — Progress Notes (Signed)
Acute Office Visit  Subjective:    Patient ID: Andrew Roth, male    DOB: Jun 06, 1970, 52 y.o.   MRN: [id number]  Chief Complaint  Patient presents with   lump on left testicle    Since 2 weeks ago    HPI: Patient is in today for complaints of lump on left testicle.  He states he noted it a few weeks ago.  Is not painful.  He is self administering testosterone and decreased his dose after last labs showed levels were elevated.  He has noticed that testicles in general seem smaller since making that adjustment Denies urine symptoms or abdominal pain Of note pt does have history of lung cancer  Past Medical History:  Diagnosis Date   Anxiety    takes Cymbalta daily   Arthritis    Asthma    Symbicort inhaler   Cough    productive   Depression    Diabetes mellitus without complication    borderline   GERD (gastroesophageal reflux disease)    occasionally but no issues in yrs    History of bronchitis    month and a half ago   History of gout    Joint pain    Lung cancer    Pneumonia    continuously for 99yrs    PONV (postoperative nausea and vomiting)    Shortness of breath dyspnea    occasionally and with exertion    Past Surgical History:  Procedure Laterality Date   APPENDECTOMY  03/2021   BACK SURGERY  05/29/1999   BACK SURGERY     CRYO INTERCOSTAL NERVE BLOCK N/A 12/01/2014   Procedure: CRYO INTERCOSTAL NERVE BLOCK;  Surgeon: Loreli Slot, MD;  Location: [facility] OR;  Service: Thoracic;  Laterality: N/A;   HERNIA REPAIR Bilateral    JOINT REPLACEMENT Left    knee   KNEE ARTHROSCOPY Left    x 3   VIDEO ASSISTED THORACOSCOPY (VATS)/ LOBECTOMY Left 12/01/2014   Procedure: VIDEO ASSISTED THORACOSCOPY (VATS)/ LOBECTOMY;  Surgeon: Loreli Slot, MD;  Location: [facility] OR;  Service: Thoracic;  Laterality: Left;   VIDEO BRONCHOSCOPY Bilateral 08/16/2014   Procedure: VIDEO BRONCHOSCOPY WITHOUT FLUORO;  Surgeon: Nyoka Cowden, MD;  Location: [facility];   Service: Endoscopy;  Laterality: Bilateral;   VIDEO BRONCHOSCOPY N/A 10/01/2014   Procedure: VIDEO BRONCHOSCOPY;  Surgeon: Loreli Slot, MD;  Location: [facility] OR;  Service: Thoracic;  Laterality: N/A;   wisdom teeth extracted      Family History  Problem Relation Age of Onset   Rheum arthritis Mother    Diabetes Mother    Prostate cancer Father    Hypertension Mother    Cancer Father    Heart disease Paternal Grandmother    Heart disease Paternal Grandfather     Social History   Socioeconomic History   Marital status: Married    Spouse name: Alyxander Kollmann   Number of children: 5   Years of education: Not on file   Highest education level: Not on file  Occupational History   Occupation: self employed  Tobacco Use   Smoking status: Never   Smokeless tobacco: Never  Vaping Use   Vaping Use: Some days  Substance and Sexual Activity   Alcohol use: Yes    Alcohol/week: 2.0 standard drinks of alcohol    Types: 2 Cans of beer per week    Comment: socially   Drug use: No   Sexual activity: Yes  Other Topics Concern  Not on file  Social History Narrative   Not on file   Social Determinants of Health   Financial Resource Strain: Not on file  Food Insecurity: Not on file  Transportation Needs: Not on file  Physical Activity: Not on file  Stress: Not on file  Social Connections: Not on file  Intimate Partner Violence: Not on file    Outpatient Medications Prior to Visit  Medication Sig Dispense Refill   amLODipine (NORVASC) 10 MG tablet Take 1 tablet (10 mg total) by mouth daily. 30 tablet 5   ascorbic acid (VITAMIN C) 1000 MG tablet Take by mouth.     diclofenac Sodium (VOLTAREN) 1 % GEL Apply topically.     DULoxetine (CYMBALTA) 30 MG capsule Take 1 capsule (30 mg total) by mouth daily. 30 capsule 5   DULoxetine (CYMBALTA) 60 MG capsule Take 1 capsule (60 mg total) by mouth daily. 30 capsule 5   Multiple Vitamin (MULTI-VITAMIN) tablet Take 1 tablet by mouth  daily.     NON FORMULARY Takes a vitamin injection every other week     Omega-3 Fatty Acids (FISH OIL) 1200 MG CAPS Take 1,200 mg by mouth daily.     methylPREDNISolone (MEDROL DOSEPAK) 4 MG TBPK tablet See admin instructions. (Patient not taking: Reported on 09/19/2022)     No facility-administered medications prior to visit.    Allergies  Allergen Reactions   Naproxen     Put him in the hospital Other reaction(s): Other (See Comments) Put him in the hospital, gallbladder spasms  Put him in the hospital    Ciprofloxacin Other (See Comments)    Stomach cramps  Other reaction(s): Other (See Comments) Stomach cramps Stomach cramps    Clarithromycin     Review of Systems CONSTITUTIONAL: Negative for chills, fatigue, fever, unintentional weight gain and unintentional weight loss.  CARDIOVASCULAR: Negative for chest pain,  RESPIRATORY: Negative for recent cough and dyspnea.  GASTROINTESTINAL: Negative for abdominal pain, acid reflux symptoms, constipation, diarrhea, nausea and vomiting.  GU - see HPI         Objective:    PHYSICAL EXAM:   VS: BP 118/60   Pulse 95   Temp 97.7 F (36.5 C)   Resp 12   Ht 5\' 10"  (1.778 m)   Wt 214 lb (97.1 kg)   SpO2 98%   BMI 30.71 kg/m   GEN: Well nourished, well developed, in no acute distress  Cardiac: RRR; no murmurs,  Respiratory:  normal respiratory rate and pattern with no distress - normal breath sounds with no rales, rhonchi, wheezes or rubs GU- normal external genitalia normal- small nodule/mass noted in left testicle - not tender to palpation   Lab Results  Component Value Date   TSH 1.570 08/13/2022   Lab Results  Component Value Date   WBC 7.1 08/13/2022   HGB 17.6 08/13/2022   HCT 51.5 (H) 08/13/2022   MCV 85 08/13/2022   PLT 379 08/13/2022   Lab Results  Component Value Date   NA 137 08/13/2022   K 4.7 08/13/2022   CO2 26 08/13/2022   GLUCOSE 88 08/13/2022   BUN 14 08/13/2022   CREATININE 1.10  08/13/2022   BILITOT 0.7 08/13/2022   ALKPHOS 70 08/13/2022   AST 18 08/13/2022   ALT 33 08/13/2022   PROT 7.3 08/13/2022   ALBUMIN 4.5 08/13/2022   CALCIUM 10.0 08/13/2022   ANIONGAP 10 05/07/2022   EGFR 82 08/13/2022   Lab Results  Component Value Date  CHOL 200 (H) 08/13/2022   Lab Results  Component Value Date   HDL 35 (L) 08/13/2022   Lab Results  Component Value Date   LDLCALC 132 (H) 08/13/2022   Lab Results  Component Value Date   TRIG 185 (H) 08/13/2022   Lab Results  Component Value Date   CHOLHDL 5.7 (H) 08/13/2022   Lab Results  Component Value Date   HGBA1C 6.4 (H) 08/13/2022       Assessment & Plan:  Scrotal mass -     US SCROTUM; Future     No orders of the defined types were placed in this encounter.   Orders Placed This Encounter  Procedures   US Scrotum     Follow-up: Return if symptoms worsen or fail to improve.  An After Visit Summary was printed and given to the patient.  Jettie Pagan [facility] [phone]

## 2022-09-20 ENCOUNTER — Other Ambulatory Visit: Payer: Self-pay | Admitting: Physician Assistant

## 2022-09-20 DIAGNOSIS — N5089 Other specified disorders of the male genital organs: Secondary | ICD-10-CM

## 2022-09-28 ENCOUNTER — Telehealth: Payer: Self-pay

## 2022-09-28 ENCOUNTER — Other Ambulatory Visit: Payer: Self-pay

## 2022-09-28 DIAGNOSIS — N5089 Other specified disorders of the male genital organs: Secondary | ICD-10-CM

## 2022-09-28 NOTE — Telephone Encounter (Signed)
Patient called today and made aware of his [place] of his Scrotum, stated that he would like to see urologist here in town. Ellwood Sayers, PA  Referral made.

## 2022-10-08 LAB — LAB REPORT - SCANNED: PSA, FREE: 0.1

## 2022-10-17 ENCOUNTER — Encounter: Payer: Self-pay | Admitting: Family Medicine

## 2023-01-02 ENCOUNTER — Ambulatory Visit: Payer: Commercial Managed Care - PPO | Admitting: Physician Assistant

## 2023-01-02 ENCOUNTER — Encounter: Payer: Self-pay | Admitting: Physician Assistant

## 2023-01-02 VITALS — BP 140/100 | HR 100 | Temp 97.7°F | Ht 70.0 in | Wt 221.8 lb

## 2023-01-02 DIAGNOSIS — Z01818 Encounter for other preprocedural examination: Secondary | ICD-10-CM | POA: Diagnosis not present

## 2023-01-02 DIAGNOSIS — I10 Essential (primary) hypertension: Secondary | ICD-10-CM | POA: Diagnosis not present

## 2023-01-02 DIAGNOSIS — G8929 Other chronic pain: Secondary | ICD-10-CM | POA: Insufficient documentation

## 2023-01-02 DIAGNOSIS — M25562 Pain in left knee: Secondary | ICD-10-CM

## 2023-01-02 NOTE — Progress Notes (Signed)
Subjective:  Patient ID: Andrew Roth, male    DOB: 03/01/1971  Age: 52 y.o. MRN: [id number]  Chief Complaint  Patient presents with   Pre-op Exam    HPI Pt in today for preop clearance for left total knee replacement.  States Dr Leonel Ramsay with [facility] ortho is planning to do the surgery.  He has chronic left knee pain with OA.  Pt with hypertension and is supposed to be taking norvasc 10mg  qd -- however it is noted bp significantly elevated today.  He denies chest pain/palpitations/edema.  He states that he stopped the medication on his own about 3 weeks ago because "blood pressure is fine at home" ---      01/02/2023    2:25 PM 08/13/2022   11:08 AM 06/28/2021    8:51 AM 06/28/2021    8:47 AM  Depression screen PHQ 2/9  Decreased Interest 0 0 0 0  Down, Depressed, Hopeless 0 0 0 0  PHQ - 2 Score 0 0 0 0  Altered sleeping 0 0 1   Tired, decreased energy 0 0 0   Change in appetite 0 0 0   Feeling bad or failure about yourself  0 0 0   Trouble concentrating 0 0 0   Moving slowly or fidgety/restless 0 0 0   Suicidal thoughts 0 0 0   PHQ-9 Score 0 0 1   Difficult doing work/chores Not difficult at all Not difficult at all Not difficult at all         04/28/2021    9:28 AM 06/28/2021    8:46 AM 05/07/2022   11:02 AM 08/13/2022   11:08 AM 01/02/2023    2:25 PM  Fall Risk  Falls in the past year?  0  0 0  Was there an injury with Fall?  0  0 0  Fall Risk Category Calculator  0  0 0  Fall Risk Category (Retired)  Low     (RETIRED) Patient Fall Risk Level Low fall risk Low fall risk Low fall risk    Patient at Risk for Falls Due to    No Fall Risks No Fall Risks  Fall risk Follow up    Falls evaluation completed Falls evaluation completed     ROS CONSTITUTIONAL: Negative for chills, fatigue, fever, CARDIOVASCULAR: Negative for chest pain, dizziness, palpitations and pedal edema.  RESPIRATORY: Negative for recent cough and dyspnea.   MSK: see HPI    Current Outpatient  Medications:    ascorbic acid (VITAMIN C) 1000 MG tablet, Take by mouth., Disp: , Rfl:    diclofenac Sodium (VOLTAREN) 1 % GEL, Apply topically., Disp: , Rfl:    DULoxetine (CYMBALTA) 30 MG capsule, Take 1 capsule (30 mg total) by mouth daily., Disp: 30 capsule, Rfl: 5   DULoxetine (CYMBALTA) 60 MG capsule, Take 1 capsule (60 mg total) by mouth daily., Disp: 30 capsule, Rfl: 5   Multiple Vitamin (MULTI-VITAMIN) tablet, Take 1 tablet by mouth daily., Disp: , Rfl:    NON FORMULARY, Takes a vitamin injection every other week, Disp: , Rfl:    Omega-3 Fatty Acids (FISH OIL) 1200 MG CAPS, Take 1,200 mg by mouth daily., Disp: , Rfl:   Past Medical History:  Diagnosis Date   Anxiety    takes Cymbalta daily   Arthritis    Asthma    Symbicort inhaler   Cough    productive   Depression    Diabetes mellitus without complication (HCC)  borderline   GERD (gastroesophageal reflux disease)    occasionally but no issues in yrs    History of bronchitis    month and a half ago   History of gout    Joint pain    Lung cancer (HCC)    Pneumonia    continuously for 16yrs    PONV (postoperative nausea and vomiting)    Shortness of breath dyspnea    occasionally and with exertion   Objective:  PHYSICAL EXAM:   BP (!) 140/100 (BP Location: Left Arm, Patient Position: Sitting, Cuff Size: Large)   Pulse 100   Temp 97.7 F (36.5 C) (Temporal)   Ht 5\' 10"  (1.778 m)   Wt 221 lb 12.8 oz (100.6 kg)   SpO2 97%   BMI 31.82 kg/m    GEN: Well nourished, well developed, in no acute distress  Cardiac: RRR; no murmurs, rubs, or gallops,no edema -  Respiratory:  normal respiratory rate and pattern with no distress - normal breath sounds with no rales, rhonchi, wheezes or rubs Skin: warm and dry, no rash   EKG - - sinus tachycardia with LVH noted  Assessment & Plan:    Uncontrolled hypertension -     CBC with Differential/Platelet -     Comprehensive metabolic panel -     EKG 12-Lead Restart  norvasc as directed Chronic pain of left knee  Preop examination -     EKG 12-Lead   No clearance for surgery until bp controlled  Follow-up: Return in about 3 weeks (around 01/23/2023) for nurse visit BP check.  An After Visit Summary was printed and given to the patient.  Jettie Pagan [facility] [phone]

## 2023-01-18 ENCOUNTER — Ambulatory Visit: Payer: Commercial Managed Care - PPO

## 2023-01-18 VITALS — BP 132/86 | HR 76

## 2023-01-18 DIAGNOSIS — I10 Essential (primary) hypertension: Secondary | ICD-10-CM

## 2023-01-18 NOTE — Progress Notes (Cosign Needed Addendum)
Patient is in office today for a nurse visit for Blood Pressure Check. Patient heart rate 76 blood pressure was 132/86, Patient No chest pain, No shortness of breath, No dyspnea on exertion, No orthopnea, No paroxysmal nocturnal dyspnea, No edema, No palpitations, No syncope

## 2023-01-23 ENCOUNTER — Ambulatory Visit: Payer: Commercial Managed Care - PPO

## 2023-02-18 ENCOUNTER — Ambulatory Visit: Payer: Commercial Managed Care - PPO | Admitting: Physician Assistant

## 2023-05-16 ENCOUNTER — Encounter: Payer: Self-pay | Admitting: Physician Assistant

## 2023-05-16 ENCOUNTER — Ambulatory Visit (INDEPENDENT_AMBULATORY_CARE_PROVIDER_SITE_OTHER): Payer: Commercial Managed Care - PPO | Admitting: Physician Assistant

## 2023-05-16 VITALS — BP 122/84 | HR 92 | Temp 97.5°F | Resp 14 | Ht 70.0 in | Wt 214.0 lb

## 2023-05-16 DIAGNOSIS — I10 Essential (primary) hypertension: Secondary | ICD-10-CM | POA: Diagnosis not present

## 2023-05-16 DIAGNOSIS — Z92241 Personal history of systemic steroid therapy: Secondary | ICD-10-CM

## 2023-05-16 DIAGNOSIS — Z125 Encounter for screening for malignant neoplasm of prostate: Secondary | ICD-10-CM

## 2023-05-16 DIAGNOSIS — E782 Mixed hyperlipidemia: Secondary | ICD-10-CM | POA: Diagnosis not present

## 2023-05-16 DIAGNOSIS — F419 Anxiety disorder, unspecified: Secondary | ICD-10-CM | POA: Diagnosis not present

## 2023-05-16 DIAGNOSIS — R7303 Prediabetes: Secondary | ICD-10-CM

## 2023-05-16 DIAGNOSIS — C3492 Malignant neoplasm of unspecified part of left bronchus or lung: Secondary | ICD-10-CM

## 2023-05-16 NOTE — Progress Notes (Addendum)
New Patient Office Visit  Subjective:  Patient ID: Andrew Roth, male    DOB: 1971/03/24  Age: 52 y.o. MRN: [id number]  CC:  Chief Complaint  Patient presents with   Medical Management of Chronic Issues    HPI Andrew Roth presents for hypertension -pt states that he has been on norvasc 10mg  qd for several years - is currently taking norvasc 10mg  qd Denies chest pain or dyspnea - bp improved   Pt with history of lung adenocarcinoma - he had left lower lobe resection in 2016 - he currently follows Dr Andrew Roth yearly  he states he was advised to do chemotherapy but chose not to have that done He states he has an alkaline water system in his home as well as receiving vitamin IV Therapy once monthly at a clinic in [place]  Pt states that he has history of fatigue and began taking some type of testosterone supplement and is now also taking peptides Pt is told this is not advised - at risk for cardiac/hepatic and other complications He states at this time he is taking weekly and would like testosterone levels checked  Pt with history of anxiety and currently taking cymbalta 90mg  qd - states this medication works well for him   Last labwork did show elevated of hgb a1c at 6.4 - is watching diet  Pt did have total left knee replacement since last visit    Past Medical History:  Diagnosis Date   Anxiety    takes Cymbalta daily   Arthritis    Asthma    Symbicort inhaler   Cough    productive   Depression    Diabetes mellitus without complication (HCC)    borderline   GERD (gastroesophageal reflux disease)    occasionally but no issues in yrs    History of bronchitis    month and a half ago   History of gout    Joint pain    Lung cancer (HCC)    Pneumonia    continuously for 32yrs    PONV (postoperative nausea and vomiting)    Shortness of breath dyspnea    occasionally and with exertion    Past Surgical History:  Procedure Laterality Date    APPENDECTOMY  03/2021   BACK SURGERY  05/29/1999   BACK SURGERY     CRYO INTERCOSTAL NERVE BLOCK N/A 12/01/2014   Procedure: CRYO INTERCOSTAL NERVE BLOCK;  Surgeon: Loreli Slot, MD;  Location: [facility] OR;  Service: Thoracic;  Laterality: N/A;   HERNIA REPAIR Bilateral    JOINT REPLACEMENT Left    knee   KNEE ARTHROSCOPY Left    x 3   VIDEO ASSISTED THORACOSCOPY (VATS)/ LOBECTOMY Left 12/01/2014   Procedure: VIDEO ASSISTED THORACOSCOPY (VATS)/ LOBECTOMY;  Surgeon: Loreli Slot, MD;  Location: [facility] OR;  Service: Thoracic;  Laterality: Left;   VIDEO BRONCHOSCOPY Bilateral 08/16/2014   Procedure: VIDEO BRONCHOSCOPY WITHOUT FLUORO;  Surgeon: Nyoka Cowden, MD;  Location: [facility];  Service: Endoscopy;  Laterality: Bilateral;   VIDEO BRONCHOSCOPY N/A 10/01/2014   Procedure: VIDEO BRONCHOSCOPY;  Surgeon: Loreli Slot, MD;  Location: [facility] OR;  Service: Thoracic;  Laterality: N/A;   wisdom teeth extracted      Family History  Problem Relation Age of Onset   Rheum arthritis Mother    Diabetes Mother    Prostate cancer Father    Hypertension Mother    Cancer Father    Heart disease Paternal Grandmother  Heart disease Paternal Grandfather     Social History   Socioeconomic History   Marital status: Married    Spouse name: Andrew Roth   Number of children: 5   Years of education: Not on file   Highest education level: Not on file  Occupational History   Occupation: self employed  Tobacco Use   Smoking status: Never   Smokeless tobacco: Never  Vaping Use   Vaping status: Never Used  Substance and Sexual Activity   Alcohol use: Yes    Alcohol/week: 2.0 standard drinks of alcohol    Types: 2 Cans of beer per week    Comment: socially   Drug use: No   Sexual activity: Yes    Partners: Female  Other Topics Concern   Not on file  Social History Narrative   Not on file   Social Drivers of Health   Financial Resource Strain: Low Risk  (01/02/2023)    Overall Financial Resource Strain (CARDIA)    Difficulty of Paying Living Expenses: Not hard at all  Food Insecurity: Low Risk  (01/22/2023)   Received from [facility]   Hunger Vital Sign    Worried About Running Out of Food in the Last Year: Never true    Ran Out of Food in the Last Year: Never true  Transportation Needs: No Transportation Needs (01/22/2023)   Received from [facility]    In the past 12 months, has lack of reliable transportation kept you from medical appointments, meetings, work or from getting things needed for daily living? : No  Physical Activity: Sufficiently Active (01/02/2023)   Exercise Vital Sign    Days of Exercise per Week: 3 days    Minutes of Exercise per Session: 60 min  Stress: No Stress Concern Present (01/02/2023)   [employer] of Occupational Health - Occupational Stress Questionnaire    Feeling of Stress : Not at all  Social Connections: Moderately Integrated (01/02/2023)   Social Connection and Isolation Panel [NHANES]    Frequency of Communication with Friends and Family: More than three times a week    Frequency of Social Gatherings with Friends and Family: Three times a week    Attends Religious Services: More than 4 times per year    Active Member of Clubs or Organizations: No    Attends Banker Meetings: Never    Marital Status: Married  Catering manager Violence: Not At Risk (01/02/2023)   Humiliation, Afraid, Rape, and Kick questionnaire    Fear of Current or Ex-Partner: No    Emotionally Abused: No    Physically Abused: No    Sexually Abused: No     Current Outpatient Medications:    amLODipine (NORVASC) 10 MG tablet, Take by mouth., Disp: , Rfl:    ascorbic acid (VITAMIN C) 1000 MG tablet, Take by mouth., Disp: , Rfl:    Cholecalciferol 1.25 MG (50000 UT) capsule, Take by mouth., Disp: , Rfl:    diclofenac Sodium (VOLTAREN) 1 % GEL, Apply topically., Disp: , Rfl:    DULoxetine (CYMBALTA) 30 MG  capsule, Take 1 capsule (30 mg total) by mouth daily., Disp: 30 capsule, Rfl: 5   Multiple Vitamin (MULTI-VITAMIN) tablet, Take 1 tablet by mouth daily., Disp: , Rfl:    NON FORMULARY, Takes a vitamin injection every other week, Disp: , Rfl:    Omega-3 Fatty Acids (FISH OIL) 1200 MG CAPS, Take 1,200 mg by mouth daily., Disp: , Rfl:    DULoxetine (CYMBALTA)  60 MG capsule, TAKE 1 CAPSULE BY MOUTH EVERY DAY, Disp: 90 capsule, Rfl: 0   HYDROcodone bit-homatropine (HYCODAN) 5-1.5 MG/5ML syrup, Take 5 mLs by mouth every 6 (six) hours as needed for cough., Disp: 60 mL, Rfl: 0   Allergies  Allergen Reactions   Naproxen Nausea And Vomiting    Put him in the hospital  Other reaction(s): Other (See Comments)  Put him in the hospital, gallbladder spasms   Put him in the hospital; abdominal pain   Ciprofloxacin Other (See Comments)    Stomach cramps  Other reaction(s): Other (See Comments) Stomach cramps Stomach cramps    Clarithromycin    CONSTITUTIONAL: Negative for chills, fatigue, fever, unintentional weight gain and unintentional weight loss.  E/N/T: Negative for ear pain, nasal congestion and sore throat.  CARDIOVASCULAR: Negative for chest pain, dizziness, palpitations and pedal edema.  RESPIRATORY: Negative for recent cough and dyspnea.  GASTROINTESTINAL: Negative for abdominal pain, acid reflux symptoms, constipation, diarrhea, nausea and vomiting.  MSK: Negative for arthralgias and myalgias.  INTEGUMENTARY: Negative for rash.  NEUROLOGICAL: Negative for dizziness and headaches.  PSYCHIATRIC: Negative for sleep disturbance and to question depression screen.  Negative for depression, negative for anhedonia.        Objective:  PHYSICAL EXAM:   VS: BP 122/84 (Cuff Size: Normal)   Pulse 92   Temp (!) 97.5 F (36.4 C)   Resp 14   Ht 5\' 10"  (1.778 m)   Wt 214 lb (97.1 kg)   SpO2 97%   BMI 30.71 kg/m   GEN: Well nourished, well developed, in no acute distress   Cardiac:  RRR; no murmurs, rubs, or gallops,no edema -  Respiratory:  normal respiratory rate and pattern with no distress - normal breath sounds with no rales, rhonchi, wheezes or rubs  MS: no deformity or atrophy  Skin: warm and dry, no rash  Neuro:  Alert and Oriented x 3,  - CN II-Xii grossly intact Psych: euthymic mood, appropriate affect and demeanor   Health Maintenance Due  Topic Date Due   Pneumococcal Vaccine 43-35 Years old (2 of 2 - PCV) 12/02/2015    There are no preventive care reminders to display for this patient.  Lab Results  Component Value Date   TSH 1.490 05/16/2023   Lab Results  Component Value Date   WBC 6.4 05/16/2023   HGB 17.4 05/16/2023   HCT 51.0 05/16/2023   MCV 87 05/16/2023   PLT 307 05/16/2023   Lab Results  Component Value Date   NA 138 05/16/2023   K 4.5 05/16/2023   CO2 23 05/16/2023   GLUCOSE 123 (H) 05/16/2023   BUN 14 05/16/2023   CREATININE 1.17 05/16/2023   BILITOT 0.8 05/16/2023   ALKPHOS 75 05/16/2023   AST 20 05/16/2023   ALT 37 05/16/2023   PROT 7.1 05/16/2023   ALBUMIN 4.7 05/16/2023   CALCIUM 9.4 05/16/2023   ANIONGAP 10 05/07/2022   EGFR 76 05/16/2023   Lab Results  Component Value Date   CHOL 183 05/16/2023   Lab Results  Component Value Date   HDL 38 (L) 05/16/2023   Lab Results  Component Value Date   LDLCALC 109 (H) 05/16/2023   Lab Results  Component Value Date   TRIG 207 (H) 05/16/2023   Lab Results  Component Value Date   CHOLHDL 4.8 05/16/2023   Lab Results  Component Value Date   HGBA1C 6.1 (H) 05/16/2023      Assessment & Plan:  Problem List Items Addressed This Visit   None Visit Diagnoses     Benign hypertension    -  Primary   Relevant Orders   CBC with Differential/Platelet   Comprehensive metabolic panel   Lipid panel Continue current meds   Other fatigue       Relevant Orders   CBC with Differential/Platelet   Comprehensive metabolic panel   Testosterone,Free and Total    TSH  ANXIETY Continue current meds as directed  Anabolic steroid/testosterone use Labwork pending  prediabetes Hgb a1c pending   Prostate cancer screening PSA    No orders of the defined types were placed in this encounter.   Follow-up: Return in about 6 months (around 11/14/2023) for chronic fasting follow-up.    SARA R Chee Kinslow, PA-C

## 2023-05-18 LAB — CBC WITH DIFFERENTIAL/PLATELET
Basophils Absolute: 0 10*3/uL (ref 0.0–0.2)
Basos: 1 %
EOS (ABSOLUTE): 0.2 10*3/uL (ref 0.0–0.4)
Eos: 3 %
Hematocrit: 51 % (ref 37.5–51.0)
Hemoglobin: 17.4 g/dL (ref 13.0–17.7)
Immature Grans (Abs): 0 10*3/uL (ref 0.0–0.1)
Immature Granulocytes: 0 %
Lymphocytes Absolute: 2.1 10*3/uL (ref 0.7–3.1)
Lymphs: 33 %
MCH: 29.6 pg (ref 26.6–33.0)
MCHC: 34.1 g/dL (ref 31.5–35.7)
MCV: 87 fL (ref 79–97)
Monocytes Absolute: 0.5 10*3/uL (ref 0.1–0.9)
Monocytes: 8 %
Neutrophils Absolute: 3.6 10*3/uL (ref 1.4–7.0)
Neutrophils: 55 %
Platelets: 307 10*3/uL (ref 150–450)
RBC: 5.87 x10E6/uL — ABNORMAL HIGH (ref 4.14–5.80)
RDW: 12.7 % (ref 11.6–15.4)
WBC: 6.4 10*3/uL (ref 3.4–10.8)

## 2023-05-18 LAB — COMPREHENSIVE METABOLIC PANEL
ALT: 37 [IU]/L (ref 0–44)
AST: 20 [IU]/L (ref 0–40)
Albumin: 4.7 g/dL (ref 4.1–5.1)
Alkaline Phosphatase: 75 [IU]/L (ref 44–121)
BUN/Creatinine Ratio: 12 (ref 9–20)
BUN: 14 mg/dL (ref 6–24)
Bilirubin Total: 0.8 mg/dL (ref 0.0–1.2)
CO2: 23 mmol/L (ref 20–29)
Calcium: 9.4 mg/dL (ref 8.7–10.2)
Chloride: 100 mmol/L (ref 96–106)
Creatinine, Ser: 1.17 mg/dL (ref 0.76–1.27)
Globulin, Total: 2.4 g/dL (ref 1.5–4.5)
Glucose: 123 mg/dL — ABNORMAL HIGH (ref 70–99)
Potassium: 4.5 mmol/L (ref 3.5–5.2)
Sodium: 138 mmol/L (ref 134–144)
Total Protein: 7.1 g/dL (ref 6.0–8.5)
eGFR: 76 mL/min/{1.73_m2} (ref >59)

## 2023-05-18 LAB — TESTOSTERONE,FREE AND TOTAL
Testosterone, Free: 5.2 pg/mL — ABNORMAL LOW (ref 7.2–24.0)
Testosterone: 347 ng/dL (ref 264–916)

## 2023-05-18 LAB — PSA: Prostate Specific Ag, Serum: 1.2 ng/mL (ref 0.0–4.0)

## 2023-05-18 LAB — LIPID PANEL
Chol/HDL Ratio: 4.8 {ratio} (ref 0.0–5.0)
Cholesterol, Total: 183 mg/dL (ref 100–199)
HDL: 38 mg/dL — ABNORMAL LOW (ref >39)
LDL Chol Calc (NIH): 109 mg/dL — ABNORMAL HIGH (ref 0–99)
Triglycerides: 207 mg/dL — ABNORMAL HIGH (ref 0–149)
VLDL Cholesterol Cal: 36 mg/dL (ref 5–40)

## 2023-05-18 LAB — HEMOGLOBIN A1C
Est. average glucose Bld gHb Est-mCnc: 128 mg/dL
Hgb A1c MFr Bld: 6.1 % — ABNORMAL HIGH (ref 4.8–5.6)

## 2023-05-18 LAB — TSH: TSH: 1.49 u[IU]/mL (ref 0.450–4.500)

## 2023-05-18 LAB — VITAMIN D 25 HYDROXY (VIT D DEFICIENCY, FRACTURES): Vit D, 25-Hydroxy: 53.8 ng/mL (ref 30.0–100.0)

## 2023-06-20 ENCOUNTER — Other Ambulatory Visit: Payer: Self-pay | Admitting: Physician Assistant

## 2023-06-20 DIAGNOSIS — F419 Anxiety disorder, unspecified: Secondary | ICD-10-CM

## 2023-07-05 ENCOUNTER — Other Ambulatory Visit: Payer: Self-pay | Admitting: Oncology

## 2023-07-05 ENCOUNTER — Telehealth: Payer: Self-pay

## 2023-07-05 DIAGNOSIS — C3492 Malignant neoplasm of unspecified part of left bronchus or lung: Secondary | ICD-10-CM

## 2023-07-05 MED ORDER — HYDROCODONE BIT-HOMATROP MBR 5-1.5 MG/5ML PO SOLN: 5.0000 mL | Freq: Four times a day (QID) | ORAL | 0 refills | Status: DC | PRN

## 2023-07-05 NOTE — Telephone Encounter (Signed)
 Left patient a detailed message to return my call regarding this message.

## 2023-07-05 NOTE — Telephone Encounter (Signed)
 Patient has called stating he would like Dr. Almer Jacobson to call in that cough syrup she has called in for him in the past. Cough is back per patients message.

## 2023-07-10 ENCOUNTER — Inpatient Hospital Stay: Payer: Commercial Managed Care - PPO | Admitting: Oncology

## 2023-07-12 ENCOUNTER — Other Ambulatory Visit: Payer: Self-pay | Admitting: Oncology

## 2023-07-12 ENCOUNTER — Inpatient Hospital Stay: Payer: Commercial Managed Care - PPO | Attending: Oncology | Admitting: Oncology

## 2023-07-12 ENCOUNTER — Inpatient Hospital Stay: Payer: Commercial Managed Care - PPO

## 2023-07-12 ENCOUNTER — Encounter: Payer: Self-pay | Admitting: Oncology

## 2023-07-12 VITALS — BP 152/88 | HR 110 | Temp 98.2°F | Resp 16 | Ht 70.0 in | Wt 221.3 lb

## 2023-07-12 DIAGNOSIS — C3432 Malignant neoplasm of lower lobe, left bronchus or lung: Secondary | ICD-10-CM | POA: Diagnosis present

## 2023-07-12 DIAGNOSIS — Z79899 Other long term (current) drug therapy: Secondary | ICD-10-CM | POA: Diagnosis not present

## 2023-07-12 DIAGNOSIS — Z886 Allergy status to analgesic agent status: Secondary | ICD-10-CM | POA: Diagnosis not present

## 2023-07-12 DIAGNOSIS — C3492 Malignant neoplasm of unspecified part of left bronchus or lung: Secondary | ICD-10-CM

## 2023-07-12 DIAGNOSIS — K760 Fatty (change of) liver, not elsewhere classified: Secondary | ICD-10-CM | POA: Diagnosis not present

## 2023-07-12 DIAGNOSIS — Z881 Allergy status to other antibiotic agents status: Secondary | ICD-10-CM | POA: Diagnosis not present

## 2023-07-12 DIAGNOSIS — Z800 Family history of malignant neoplasm of digestive organs: Secondary | ICD-10-CM | POA: Diagnosis not present

## 2023-07-12 LAB — BASIC METABOLIC PANEL - CANCER CENTER ONLY
Anion gap: 13 (ref 5–15)
BUN: 12 mg/dL (ref 6–20)
CO2: 25 mmol/L (ref 22–32)
Calcium: 10.4 mg/dL — ABNORMAL HIGH (ref 8.9–10.3)
Chloride: 100 mmol/L (ref 98–111)
Creatinine: 1.13 mg/dL (ref 0.61–1.24)
GFR, Estimated: >60 mL/min (ref >60)
Glucose, Bld: 120 mg/dL — ABNORMAL HIGH (ref 70–99)
Potassium: 4 mmol/L (ref 3.5–5.1)
Sodium: 138 mmol/L (ref 135–145)

## 2023-07-12 LAB — CEA (ACCESS): CEA (CHCC): 1.06 ng/mL (ref 0.00–5.00)

## 2023-07-12 NOTE — Progress Notes (Signed)
 [facility]  [address] 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  He declined adjuvant chemotherapy but has had no evidence of recurrence.    2.   Hepatic steatosis, as seen on CT abdomen and pelvis from October 2022. His liver function tests are normal.   Plan: His BP today is elevated at 152/88 when taken manually after he had reading of 147/106. He notes that his BP at home ranges in the 130's/90-80's and he did drink coffee before his appointment today. He continues Norvasc 10 mg without difficulty. His last CT scan was done in December, 2022. I will schedule a repeat CT chest without contrast as he gets really sick when taking contrast and it worsens each time.  His CMP is normal other than a slight elevated calcium of 10.4. His CEA is pending from today. He also had labs done on 05/16/2023 and his PSA was normal at 1.2, TSH of 1.490, and his testosterone improved from 1,116 to 347. He notes exercising more and taking peptide supplements and has noticed a physical and mental improvement. I will see him back in 1 year with CBC, CMP, CEA, and CT chest without contrast.  He understands and agrees with this plan of care.   I provided 14 minutes of face-to-face time during this this encounter and > 50% was spent counseling as documented under my assessment and plan.   Dellia Beckwith, MD  [facility] - A DEPT OF [facility] [address] [place] [address] Dept: [phone] Dept Fax: [phone]   No orders of the defined types were placed in this encounter.  CHIEF COMPLAINT:  CC: History of stage IIB adenocarcinoma of the left lower lobe  Current Treatment:  Surveillance  HISTORY OF PRESENT ILLNESS:  Andrew Roth is a 53 y.o. 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  We began seeing him in May 2017, when he transferred his care here.  He was treated with surgical resection in July 2016.  Pathology revealed a 7.2 cm well-differentiated adenocarcinoma with 8 lymph nodes negative for metastasis.  We do not have the results of any genomic testing done on the tumor.  Adjuvant chemotherapy was recommended, but he declined.  Postoperative PET scan in August 2016 did not reveal any evidence of malignancy.  CT chest in November 2017 revealed postsurgical changes in the left lower lobe, but no evidence of malignancy.  CT chest in July 2018 did not reveal any evidence of malignancy.  The patient has never smoked.  He had Prevnar 13 and Pneumovax 23 in 2018. His father was diagnosed with colon cancer at age 53 and a history of prostate cancer.  He did have COVID in August 2021.   I have reviewed his chart and materials related to his cancer extensively and collaborated history with the patient. Summary of oncologic history is as follows: Oncology History   No history exists.   INTERVAL HISTORY:  Andrew Roth is here for annual follow up for his history of stage IIB adenocarcinoma of the left lower lobe. Patient states that he feels well and has no complaints of pain. He informed me that he had his  left knee replaced in August, 2024 and has healed well. His BP today is elevated at 152/88 when taken manually after he had reading of 147/106. He notes that his BP at home ranges in the 130's/90-80's and he did drink coffee before his appointment today. He continues Norvasc 10mg  without difficulty. His last CT scan was done in December, 2022. I will schedule a repeat CT chest without contrast as he gets really sick when taking contrast and it worsens each time.  His CMP is normal other than a slight elevated calcium of 10.4. His CEA is pending from today. He also  had labs done on 05/16/2023 and his PSA was normal at 1.2, TSH of 1.490, and his testosterone improved from 1,116 to 347. He notes exercising more and taking peptide supplements and has noticed a physical and mental improvement. I will see him back in 1 year with CBC, CMP, CEA, and CT chest without contrast. He denies signs of infection such as sore throat, sinus drainage, cough, or urinary symptoms.  He denies fevers or recurrent chills. He denies pain. He denies nausea, vomiting, chest pain, dyspnea or cough. His appetite is good and his weight has increased 7 pounds over last 2 months .  HISTORY:   Allergies  Allergen Reactions   Naproxen Nausea And Vomiting    Put him in the hospital  Other reaction(s): Other (See Comments)  Put him in the hospital, gallbladder spasms   Put him in the hospital; abdominal pain   Ciprofloxacin Other (See Comments)    Stomach cramps  Other reaction(s): Other (See Comments) Stomach cramps Stomach cramps    Clarithromycin    Current Medications: Current Outpatient Medications  Medication Sig Dispense Refill   amLODipine (NORVASC) 10 MG tablet Take by mouth.     ascorbic acid (VITAMIN C) 1000 MG tablet Take by mouth.     Cholecalciferol 1.25 MG (50000 UT) capsule Take by mouth.     diclofenac Sodium (VOLTAREN) 1 % GEL Apply topically.     DULoxetine (CYMBALTA) 30 MG capsule TAKE 1 CAPSULE BY MOUTH EVERY DAY 30 capsule 3   DULoxetine (CYMBALTA) 60 MG capsule TAKE 1 CAPSULE BY MOUTH EVERY DAY 90 capsule 0   HYDROcodone bit-homatropine (HYCODAN) 5-1.5 MG/5ML syrup Take 5 mLs by mouth every 6 (six) hours as needed for cough. 60 mL 0   Multiple Vitamin (MULTI-VITAMIN) tablet Take 1 tablet by mouth daily.     NON FORMULARY Takes a vitamin injection every other week     Omega-3 Fatty Acids (FISH OIL) 1200 MG CAPS Take 1,200 mg by mouth daily.     No current facility-administered medications for this visit.    REVIEW OF SYSTEMS:  Review of Systems   Constitutional: Negative.  Negative for appetite change, chills, diaphoresis, fatigue, fever and unexpected weight change.  HENT:  Negative.  Negative for hearing loss, lump/mass, mouth sores, nosebleeds, sore throat, tinnitus, trouble swallowing and voice change.   Eyes: Negative.  Negative for eye problems and icterus.  Respiratory:  Negative for chest tightness, cough, hemoptysis, shortness of breath and wheezing.   Cardiovascular: Negative.  Negative for chest pain, leg swelling and palpitations.  Gastrointestinal: Negative.  Negative for abdominal distention, abdominal pain, blood in stool, constipation, diarrhea, nausea, rectal pain and vomiting.  Endocrine: Negative.  Negative for hot flashes.  Genitourinary: Negative.  Negative for bladder incontinence, difficulty urinating, dyspareunia, dysuria, frequency, hematuria, nocturia, pelvic pain and penile discharge.   Musculoskeletal: Negative.  Negative for arthralgias, back  pain, flank pain, gait problem, myalgias, neck pain and neck stiffness.  Skin: Negative.  Negative for itching, rash and wound.  Neurological: Negative.  Negative for dizziness, extremity weakness, gait problem, headaches, light-headedness, numbness, seizures and speech difficulty.  Hematological: Negative.  Negative for adenopathy. Does not bruise/bleed easily.  Psychiatric/Behavioral: Negative.  Negative for confusion, decreased concentration, depression, sleep disturbance and suicidal ideas. The patient is not nervous/anxious.   All other systems reviewed and are negative.  VITALS:  Blood pressure (!) 152/88, pulse (!) 110, temperature 98.2 F (36.8 C), temperature source Oral, resp. rate 16, height 5\' 10"  (1.778 m), weight 221 lb 4.8 oz (100.4 kg), SpO2 98%.  Wt Readings from Last 3 Encounters:  07/12/23 221 lb 4.8 oz (100.4 kg)  05/16/23 214 lb (97.1 kg)  01/02/23 221 lb 12.8 oz (100.6 kg)    Body mass index is 31.75 kg/m.  Performance status (ECOG): 1 -  Symptomatic but completely ambulatory  PHYSICAL EXAM:  Physical Exam Constitutional:      General: He is not in acute distress.    Appearance: Normal appearance. He is normal weight.  HENT:     Head: Normocephalic and atraumatic.     Right Ear: Tympanic membrane, ear canal and external ear normal. There is no impacted cerumen.     Left Ear: Tympanic membrane, ear canal and external ear normal. There is no impacted cerumen.     Nose: Nose normal. No congestion or rhinorrhea.     Mouth/Throat:     Mouth: Mucous membranes are moist.     Pharynx: Oropharynx is clear. No oropharyngeal exudate or posterior oropharyngeal erythema.  Eyes:     General: No scleral icterus.    Extraocular Movements: Extraocular movements intact.     Conjunctiva/sclera: Conjunctivae normal.     Pupils: Pupils are equal, round, and reactive to light.  Cardiovascular:     Rate and Rhythm: Regular rhythm. Tachycardia present.     Pulses: Normal pulses.     Heart sounds: Normal heart sounds. No murmur heard.    No friction rub. No gallop.  Pulmonary:     Effort: Pulmonary effort is normal. No respiratory distress.     Breath sounds: Normal breath sounds.  Abdominal:     General: Bowel sounds are normal. There is no distension.     Palpations: Abdomen is soft. There is no hepatomegaly, splenomegaly or mass.     Tenderness: There is no abdominal tenderness.     Comments: Small incisions in the midline lower abdomen which are healing very well.  Musculoskeletal:        General: Normal range of motion.     Cervical back: Normal range of motion and neck supple.     Right lower leg: No edema.     Left lower leg: No edema.  Lymphadenopathy:     Cervical: No cervical adenopathy.  Skin:    General: Skin is warm and dry.  Neurological:     General: No focal deficit present.     Mental Status: He is alert and oriented to person, place, and time. Mental status is at baseline.  Psychiatric:        Mood and Affect:  Mood normal.        Behavior: Behavior normal.        Thought Content: Thought content normal.        Judgment: Judgment normal.     LABS:      Latest Ref Rng & Units 05/16/2023  8:38 AM 01/02/2023    2:55 PM 08/13/2022   11:46 AM  CBC  WBC 3.4 - 10.8 x10E3/uL 6.4  7.3  7.1   Hemoglobin 13.0 - 17.7 g/dL 40.9  81.1  91.4   Hematocrit 37.5 - 51.0 % 51.0  49.8  51.5   Platelets 150 - 450 x10E3/uL 307  310  379       Latest Ref Rng & Units 07/12/2023    3:14 PM 05/16/2023    8:38 AM 01/02/2023    2:55 PM  CMP  Glucose 70 - 99 mg/dL 782  956  213   BUN 6 - 20 mg/dL 12  14  12    Creatinine 0.61 - 1.24 mg/dL 0.86  5.78  4.69   Sodium 135 - 145 mmol/L 138  138  139   Potassium 3.5 - 5.1 mmol/L 4.0  4.5  4.6   Chloride 98 - 111 mmol/L 100  100  98   CO2 22 - 32 mmol/L 25  23  26    Calcium 8.9 - 10.3 mg/dL 62.9  9.4  9.5   Total Protein 6.0 - 8.5 g/dL  7.1  7.1   Total Bilirubin 0.0 - 1.2 mg/dL  0.8  0.6   Alkaline Phos 44 - 121 IU/L  75  69   AST 0 - 40 IU/L  20  27   ALT 0 - 44 IU/L  37  45    Lab Results  Component Value Date   CEA1 1.2 05/07/2022   CEA 1.06 07/12/2023   /  CEA  Date Value Ref Range Status  05/07/2022 1.2 0.0 - 4.7 ng/mL Final    Comment:    (NOTE)                             Nonsmokers          <3.9                             Smokers             <5.6 [employer] Electrochemiluminescence Immunoassay (ECLIA) Values obtained with different assay methods or kits cannot be used interchangeably.  Results cannot be interpreted as absolute evidence of the presence or absence of malignant disease. Performed At: [facility] [address] [place], [place] [id number] Jolene Schimke MD WN:[id number]    CEA [facility])  Date Value Ref Range Status  07/12/2023 1.06 0.00 - 5.00 ng/mL Final    Comment:    (NOTE) This test was performed using Beckman Coulter's paramagnetic chemiluminescent immunoassay. Values obtained from different assay methods  cannot be used interchangeably. Please note that up to 8% of patients who smoke may see values 5.1-10.0 ng/ml and 1% of patients who smoke may see CEA levels >10.0 ng/ml. Performed at [facility], [address], [place], [place] [address]    Lab Results  Component Value Date   PSA1 1.2 05/16/2023   Lab Results  Component Value Date   TSH 1.490 05/16/2023   Lab Results  Component Value Date   VD25OH 53.8 05/16/2023   STUDIES:  No results found.     I,Jasmine M Lassiter,acting as a scribe for Dellia Beckwith, MD.,have documented all relevant documentation on the behalf of Dellia Beckwith, MD,as directed by  Dellia Beckwith, MD while in the presence of Dellia Beckwith, MD.

## 2023-07-15 ENCOUNTER — Other Ambulatory Visit: Payer: Self-pay | Admitting: Physician Assistant

## 2023-07-15 ENCOUNTER — Ambulatory Visit (INDEPENDENT_AMBULATORY_CARE_PROVIDER_SITE_OTHER)
Admission: RE | Admit: 2023-07-15 | Discharge: 2023-07-15 | Disposition: A | Discharge status: Home / Self Care | Payer: Commercial Managed Care - PPO | Source: Ambulatory Visit | Attending: Oncology | Admitting: Oncology

## 2023-07-15 DIAGNOSIS — C3492 Malignant neoplasm of unspecified part of left bronchus or lung: Secondary | ICD-10-CM | POA: Diagnosis not present

## 2023-07-15 DIAGNOSIS — F419 Anxiety disorder, unspecified: Secondary | ICD-10-CM

## 2023-07-16 ENCOUNTER — Ambulatory Visit: Payer: Commercial Managed Care - PPO | Admitting: Physician Assistant

## 2023-08-20 ENCOUNTER — Other Ambulatory Visit: Payer: Self-pay | Admitting: Physician Assistant

## 2023-08-26 ENCOUNTER — Other Ambulatory Visit: Payer: Self-pay

## 2023-08-26 NOTE — Progress Notes (Signed)
 Error

## 2023-08-28 ENCOUNTER — Telehealth: Payer: Self-pay

## 2023-08-28 ENCOUNTER — Other Ambulatory Visit: Payer: Self-pay | Admitting: Oncology

## 2023-08-28 DIAGNOSIS — Z902 Acquired absence of lung [part of]: Secondary | ICD-10-CM

## 2023-08-28 MED ORDER — HYDROCODONE BIT-HOMATROP MBR 5-1.5 MG/5ML PO SOLN
5.0000 mL | Freq: Four times a day (QID) | ORAL | 0 refills | Status: DC | PRN
Start: 2023-08-28 — End: 2023-09-03

## 2023-08-28 NOTE — Telephone Encounter (Signed)
 Patient called wanting to know what his CT scan results were and also wants to know if we can refill his cough medication. If so can call it into [facility] in [place].

## 2023-09-03 ENCOUNTER — Other Ambulatory Visit: Payer: Self-pay | Admitting: Oncology

## 2023-09-03 DIAGNOSIS — Z902 Acquired absence of lung [part of]: Secondary | ICD-10-CM

## 2023-09-03 MED ORDER — HYDROCODONE BIT-HOMATROP MBR 5-1.5 MG/5ML PO SOLN: 5.0000 mL | Freq: Four times a day (QID) | ORAL | 0 refills | Status: DC | PRN

## 2023-09-03 NOTE — Telephone Encounter (Signed)
Patient called and was notified of message

## 2023-10-15 ENCOUNTER — Other Ambulatory Visit: Payer: Self-pay | Admitting: Physician Assistant

## 2023-10-15 DIAGNOSIS — F419 Anxiety disorder, unspecified: Secondary | ICD-10-CM

## 2023-11-01 ENCOUNTER — Ambulatory Visit: Admitting: Physician Assistant

## 2023-11-04 ENCOUNTER — Encounter: Payer: Self-pay | Admitting: Physician Assistant

## 2023-11-04 ENCOUNTER — Ambulatory Visit: Admitting: Physician Assistant

## 2023-11-04 VITALS — BP 120/84 | HR 95 | Temp 98.5°F | Resp 18 | Ht 70.0 in | Wt 221.0 lb

## 2023-11-04 DIAGNOSIS — F419 Anxiety disorder, unspecified: Secondary | ICD-10-CM | POA: Diagnosis not present

## 2023-11-04 DIAGNOSIS — I10 Essential (primary) hypertension: Secondary | ICD-10-CM

## 2023-11-04 MED ORDER — LORAZEPAM 0.5 MG PO TABS: 0.5000 mg | ORAL_TABLET | Freq: Two times a day (BID) | ORAL | 1 refills | Status: AC | PRN

## 2023-11-04 NOTE — Progress Notes (Signed)
 Subjective:  Patient ID: Andrew Roth, male    DOB: Sep 14, 1970  Age: 53 y.o. MRN: [id number]  Chief Complaint  Patient presents with   Anxiety    HPI  Pt in today to request medication to help with breakthrough anxiety.  States he takes cymbalta  90mg  qd and occasionally a few times per week gets overwhelmed and anxious.  Feels like if he could take something as needed when feeling that way it would help symptoms Has never been on medication like this in past.    Pt with hypertension - on norvasc  10mg  qd - today bp good Denies chest pain / sob    11/04/2023    8:38 AM 05/16/2023    8:21 AM 01/02/2023    2:25 PM 08/13/2022   11:08 AM 06/28/2021    8:51 AM  Depression screen PHQ 2/9  Decreased Interest 0 0 0 0 0  Down, Depressed, Hopeless 0 0 0 0 0  PHQ - 2 Score 0 0 0 0 0  Altered sleeping 3 1 0 0 1  Tired, decreased energy 0 0 0 0 0  Change in appetite 0 0 0 0 0  Feeling bad or failure about yourself  0 0 0 0 0  Trouble concentrating 0 0 0 0 0  Moving slowly or fidgety/restless 0 0 0 0 0  Suicidal thoughts 0 0 0 0 0  PHQ-9 Score 3 1 0 0 1  Difficult doing work/chores Not difficult at all Not difficult at all Not difficult at all Not difficult at all Not difficult at all        05/07/2022   11:02 AM 08/13/2022   11:08 AM 01/02/2023    2:25 PM 05/16/2023    8:21 AM 11/04/2023    8:38 AM  Fall Risk  Falls in the past year?  0 0 0 0  Was there an injury with Fall?  0 0 0 0  Fall Risk Category Calculator  0 0 0 0  (RETIRED) Patient Fall Risk Level Low fall risk      Patient at Risk for Falls Due to  No Fall Risks No Fall Risks No Fall Risks No Fall Risks  Fall risk Follow up  Falls evaluation completed Falls evaluation completed Falls evaluation completed Falls evaluation completed     ROS CONSTITUTIONAL: Negative for chills, fatigue,  CARDIOVASCULAR: Negative for chest pain, dizziness, palpitations and pedal edema.  RESPIRATORY: Negative for recent cough and dyspnea.    PSYCHIATRIC: see HPI   Current Outpatient Medications:    amLODipine  (NORVASC ) 10 MG tablet, TAKE ONE TABLET BY MOUTH EVERY DAY, Disp: 30 tablet, Rfl: 2   ascorbic acid (VITAMIN C) 1000 MG tablet, Take by mouth., Disp: , Rfl:    Cholecalciferol 1.25 MG (50000 UT) capsule, Take by mouth., Disp: , Rfl:    diclofenac (VOLTAREN) 75 MG EC tablet, Take 75 mg by mouth 2 (two) times daily., Disp: , Rfl:    diclofenac Sodium (VOLTAREN) 1 % GEL, Apply topically., Disp: , Rfl:    DULoxetine  (CYMBALTA ) 30 MG capsule, TAKE 1 CAPSULE BY MOUTH EVERY DAY, Disp: 30 capsule, Rfl: 3   DULoxetine  (CYMBALTA ) 60 MG capsule, TAKE 1 CAPSULE BY MOUTH EVERY DAY, Disp: 90 capsule, Rfl: 0   LORazepam (ATIVAN) 0.5 MG tablet, Take 1 tablet (0.5 mg total) by mouth 2 (two) times daily as needed for anxiety., Disp: 30 tablet, Rfl: 1   Multiple Vitamin (MULTI-VITAMIN) tablet, Take 1 tablet by mouth daily., Disp: ,  Rfl:    NON FORMULARY, Takes a vitamin injection every other week, Disp: , Rfl:    Omega-3 Fatty Acids (FISH OIL) 1200 MG CAPS, Take 1,200 mg by mouth daily., Disp: , Rfl:   Past Medical History:  Diagnosis Date   Anxiety    takes Cymbalta  daily   Arthritis    Asthma    Symbicort  inhaler   Cough    productive   Depression    Diabetes mellitus without complication (HCC)    borderline   GERD (gastroesophageal reflux disease)    occasionally but no issues in yrs    History of bronchitis    month and a half ago   History of gout    Joint pain    Lung cancer (HCC)    Pneumonia    continuously for 89yrs    PONV (postoperative nausea and vomiting)    Shortness of breath dyspnea    occasionally and with exertion   Objective:  PHYSICAL EXAM:   BP 120/84   Pulse 95   Temp 98.5 F (36.9 C) (Temporal)   Resp 18   Ht 5\' 10"  (1.778 m)   Wt 221 lb (100.2 kg)   SpO2 98%   BMI 31.71 kg/m    GEN: Well nourished, well developed, in no acute distress  Cardiac: RRR; no murmurs,  Respiratory:  normal  respiratory rate and pattern with no distress - normal breath sounds with no rales, rhonchi, wheezes or rubs  Psych: euthymic mood, appropriate affect and demeanor  Assessment & Plan:   Benign hypertension Continue norvasc  10mg  qd Anxiety -     LORazepam; Take 1 tablet (0.5 mg total) by mouth 2 (two) times daily as needed for anxiety.  Dispense: 30 tablet; Refill: 1 Continue cymbalta     Follow-up: Return for for next chronic visit.  An After Visit Summary was printed and given to the patient.  Anthonette Bastos [facility] [phone]

## 2023-12-23 ENCOUNTER — Other Ambulatory Visit: Payer: Self-pay | Admitting: Physician Assistant

## 2023-12-23 DIAGNOSIS — F419 Anxiety disorder, unspecified: Secondary | ICD-10-CM

## 2023-12-30 ENCOUNTER — Other Ambulatory Visit (HOSPITAL_BASED_OUTPATIENT_CLINIC_OR_DEPARTMENT_OTHER): Payer: Self-pay | Admitting: Sports Medicine

## 2023-12-30 DIAGNOSIS — M25511 Pain in right shoulder: Secondary | ICD-10-CM

## 2024-01-03 ENCOUNTER — Ambulatory Visit (HOSPITAL_BASED_OUTPATIENT_CLINIC_OR_DEPARTMENT_OTHER)
Admission: RE | Admit: 2024-01-03 | Discharge: 2024-01-03 | Disposition: A | Discharge status: Home / Self Care | Source: Ambulatory Visit | Attending: Sports Medicine | Admitting: Sports Medicine

## 2024-01-03 DIAGNOSIS — M75121 Complete rotator cuff tear or rupture of right shoulder, not specified as traumatic: Secondary | ICD-10-CM | POA: Diagnosis not present

## 2024-01-03 DIAGNOSIS — M19011 Primary osteoarthritis, right shoulder: Secondary | ICD-10-CM | POA: Insufficient documentation

## 2024-01-03 DIAGNOSIS — M25511 Pain in right shoulder: Secondary | ICD-10-CM

## 2024-02-01 ENCOUNTER — Other Ambulatory Visit: Payer: Self-pay | Admitting: Physician Assistant

## 2024-02-01 DIAGNOSIS — F419 Anxiety disorder, unspecified: Secondary | ICD-10-CM

## 2024-04-04 ENCOUNTER — Other Ambulatory Visit: Payer: Self-pay | Admitting: Physician Assistant

## 2024-04-28 ENCOUNTER — Other Ambulatory Visit: Payer: Self-pay | Admitting: Physician Assistant

## 2024-04-28 DIAGNOSIS — F419 Anxiety disorder, unspecified: Secondary | ICD-10-CM

## 2024-05-14 ENCOUNTER — Telehealth: Payer: Self-pay

## 2024-05-14 NOTE — Telephone Encounter (Signed)
 Spoke with patient, he states he has the flu, and that it is getting to where it is hard to breathe and his wife is a [employer] and she told him to request albuterol .

## 2024-05-14 NOTE — Telephone Encounter (Signed)
 Copied from CRM #[id number]. Topic: Clinical - Medication Question >> May 14, 2024  9:12 AM Emylou G wrote: Reason for CRM: Patient called.. said he was seen yesterday at urgent care and they diagnosed him w/flu A and gave him a kenolog shot.. he wants to know if he can get a script for abuterol.. Pls call and review

## 2024-05-14 NOTE — Telephone Encounter (Signed)
 Please call patient and see why he is requesting albuterol  --- this is not usual medication he takes and was he having same symptoms when seen at urgent care??

## 2024-05-14 NOTE — Telephone Encounter (Signed)
 If he is having trouble breathing now recommend he get seen again -- may need more than just albuterol  --- need to make sure not has pneumonia, need prednisone  also, etc

## 2024-05-14 NOTE — Telephone Encounter (Signed)
 Spoke with patient, verbalized understanding and had no questions at this time. He stated he would go get rechecked.

## 2024-07-10 ENCOUNTER — Inpatient Hospital Stay: Payer: Commercial Managed Care - PPO

## 2024-07-10 ENCOUNTER — Inpatient Hospital Stay: Payer: Commercial Managed Care - PPO | Admitting: Oncology
